# Patient Record
Sex: Female | Born: 1975 | Race: White | Hispanic: No | Marital: Married | Smoking: Former smoker
Health system: Southern US, Community
[De-identification: ages and names within clinical notes are randomized; demographics above are authoritative.]

## PROBLEM LIST (undated history)

## (undated) DIAGNOSIS — G43909 Migraine, unspecified, not intractable, without status migrainosus: Secondary | ICD-10-CM

## (undated) DIAGNOSIS — N63 Unspecified lump in unspecified breast: Secondary | ICD-10-CM

## (undated) HISTORY — PX: BREAST LUMPECTOMY: SHX2

## (undated) HISTORY — PX: COLONOSCOPY: SHX174

## (undated) HISTORY — DX: Migraine, unspecified, not intractable, without status migrainosus: G43.909

## (undated) HISTORY — PX: BREAST EXCISIONAL BIOPSY: SUR124

## (undated) HISTORY — PX: ABDOMINAL HYSTERECTOMY: SHX81

---

## 2001-12-26 HISTORY — PX: APPENDECTOMY: SHX54

## 2003-02-20 ENCOUNTER — Encounter: Payer: Self-pay | Admitting: Obstetrics and Gynecology

## 2003-02-20 ENCOUNTER — Encounter: Admission: RE | Admit: 2003-02-20 | Discharge: 2003-02-20 | Payer: Self-pay | Admitting: Obstetrics and Gynecology

## 2003-02-20 ENCOUNTER — Other Ambulatory Visit: Admission: RE | Admit: 2003-02-20 | Discharge: 2003-02-20 | Payer: Self-pay | Admitting: Obstetrics and Gynecology

## 2004-02-24 ENCOUNTER — Other Ambulatory Visit: Admission: RE | Admit: 2004-02-24 | Discharge: 2004-02-24 | Payer: Self-pay | Admitting: Obstetrics and Gynecology

## 2004-03-09 ENCOUNTER — Encounter: Admission: RE | Admit: 2004-03-09 | Discharge: 2004-03-09 | Payer: Self-pay | Admitting: Obstetrics and Gynecology

## 2005-03-22 ENCOUNTER — Other Ambulatory Visit: Admission: RE | Admit: 2005-03-22 | Discharge: 2005-03-22 | Payer: Self-pay | Admitting: Obstetrics and Gynecology

## 2005-03-25 ENCOUNTER — Encounter: Admission: RE | Admit: 2005-03-25 | Discharge: 2005-03-25 | Payer: Self-pay | Admitting: Obstetrics and Gynecology

## 2006-02-03 ENCOUNTER — Encounter: Admission: RE | Admit: 2006-02-03 | Discharge: 2006-02-03 | Payer: Self-pay | Admitting: Family Medicine

## 2006-04-11 ENCOUNTER — Other Ambulatory Visit: Admission: RE | Admit: 2006-04-11 | Discharge: 2006-04-11 | Payer: Self-pay | Admitting: Obstetrics and Gynecology

## 2007-07-27 ENCOUNTER — Ambulatory Visit (HOSPITAL_COMMUNITY): Admission: RE | Admit: 2007-07-27 | Discharge: 2007-07-27 | Payer: Self-pay | Admitting: Obstetrics and Gynecology

## 2007-08-30 ENCOUNTER — Inpatient Hospital Stay (HOSPITAL_COMMUNITY): Admission: AD | Admit: 2007-08-30 | Discharge: 2007-08-30 | Payer: Self-pay | Admitting: Obstetrics and Gynecology

## 2007-10-02 ENCOUNTER — Inpatient Hospital Stay (HOSPITAL_COMMUNITY): Admission: AD | Admit: 2007-10-02 | Discharge: 2007-10-05 | Payer: Self-pay | Admitting: Obstetrics and Gynecology

## 2007-10-03 ENCOUNTER — Encounter (HOSPITAL_COMMUNITY): Payer: Self-pay | Admitting: Obstetrics and Gynecology

## 2008-03-08 ENCOUNTER — Encounter: Admission: RE | Admit: 2008-03-08 | Discharge: 2008-03-08 | Payer: Self-pay | Admitting: Otolaryngology

## 2008-03-26 ENCOUNTER — Encounter: Payer: Self-pay | Admitting: Otolaryngology

## 2008-03-31 ENCOUNTER — Ambulatory Visit (HOSPITAL_COMMUNITY): Admission: RE | Admit: 2008-03-31 | Discharge: 2008-03-31 | Payer: Self-pay | Admitting: Interventional Radiology

## 2008-07-03 ENCOUNTER — Ambulatory Visit: Payer: Self-pay | Admitting: Gastroenterology

## 2008-07-03 DIAGNOSIS — R1032 Left lower quadrant pain: Secondary | ICD-10-CM | POA: Insufficient documentation

## 2008-07-07 ENCOUNTER — Ambulatory Visit: Payer: Self-pay | Admitting: Gastroenterology

## 2008-08-06 ENCOUNTER — Ambulatory Visit: Payer: Self-pay | Admitting: Gastroenterology

## 2008-08-29 ENCOUNTER — Encounter: Admission: RE | Admit: 2008-08-29 | Discharge: 2008-08-29 | Payer: Self-pay | Admitting: Family Medicine

## 2009-04-20 ENCOUNTER — Ambulatory Visit (HOSPITAL_COMMUNITY): Admission: RE | Admit: 2009-04-20 | Discharge: 2009-04-20 | Payer: Self-pay | Admitting: Interventional Radiology

## 2010-03-11 ENCOUNTER — Encounter: Admission: RE | Admit: 2010-03-11 | Discharge: 2010-03-11 | Payer: Self-pay | Admitting: Family Medicine

## 2010-04-29 ENCOUNTER — Inpatient Hospital Stay (HOSPITAL_COMMUNITY): Admission: AD | Admit: 2010-04-29 | Discharge: 2010-05-01 | Payer: Self-pay | Admitting: Obstetrics and Gynecology

## 2010-05-02 ENCOUNTER — Inpatient Hospital Stay (HOSPITAL_COMMUNITY): Admission: AD | Admit: 2010-05-02 | Discharge: 2010-05-02 | Payer: Self-pay | Admitting: Obstetrics and Gynecology

## 2010-06-16 ENCOUNTER — Inpatient Hospital Stay (HOSPITAL_COMMUNITY): Admission: AD | Admit: 2010-06-16 | Discharge: 2010-06-16 | Payer: Self-pay | Admitting: Obstetrics & Gynecology

## 2010-06-16 ENCOUNTER — Ambulatory Visit: Payer: Self-pay | Admitting: Advanced Practice Midwife

## 2010-07-26 ENCOUNTER — Inpatient Hospital Stay (HOSPITAL_COMMUNITY): Admission: AD | Admit: 2010-07-26 | Discharge: 2010-07-26 | Payer: Self-pay | Admitting: Obstetrics and Gynecology

## 2010-08-16 ENCOUNTER — Inpatient Hospital Stay (HOSPITAL_COMMUNITY): Admission: RE | Admit: 2010-08-16 | Discharge: 2010-08-19 | Payer: Self-pay | Admitting: Obstetrics and Gynecology

## 2011-03-11 LAB — CBC
HCT: 28.9 % — ABNORMAL LOW (ref 36.0–46.0)
HCT: 39.5 % (ref 36.0–46.0)
Hemoglobin: 10.1 g/dL — ABNORMAL LOW (ref 12.0–15.0)
Hemoglobin: 13.4 g/dL (ref 12.0–15.0)
MCHC: 34.8 g/dL (ref 30.0–36.0)
MCV: 91.5 fL (ref 78.0–100.0)
RBC: 3.15 MIL/uL — ABNORMAL LOW (ref 3.87–5.11)
RBC: 4.32 MIL/uL (ref 3.87–5.11)
RDW: 15.3 % (ref 11.5–15.5)
WBC: 8.3 10*3/uL (ref 4.0–10.5)
WBC: 9.1 10*3/uL (ref 4.0–10.5)

## 2011-03-11 LAB — SURGICAL PCR SCREEN
MRSA, PCR: NEGATIVE
Staphylococcus aureus: NEGATIVE

## 2011-03-13 LAB — URINALYSIS, ROUTINE W REFLEX MICROSCOPIC
Glucose, UA: NEGATIVE mg/dL
Hgb urine dipstick: NEGATIVE
Protein, ur: NEGATIVE mg/dL
Specific Gravity, Urine: <1.005 — ABNORMAL LOW (ref 1.005–1.030)

## 2011-03-15 LAB — URINALYSIS, ROUTINE W REFLEX MICROSCOPIC
Glucose, UA: NEGATIVE mg/dL
Ketones, ur: NEGATIVE mg/dL
Nitrite: NEGATIVE
Nitrite: NEGATIVE
Protein, ur: NEGATIVE mg/dL
Specific Gravity, Urine: <1.005 — ABNORMAL LOW (ref 1.005–1.030)
Urobilinogen, UA: 0.2 mg/dL (ref 0.0–1.0)
pH: 6.5 (ref 5.0–8.0)
pH: 7 (ref 5.0–8.0)

## 2011-03-15 LAB — CBC
MCHC: 34.3 g/dL (ref 30.0–36.0)
RBC: 3.87 MIL/uL (ref 3.87–5.11)
RDW: 12.8 % (ref 11.5–15.5)

## 2011-03-15 LAB — URINE MICROSCOPIC-ADD ON

## 2011-03-15 LAB — DIFFERENTIAL
Basophils Absolute: 0 10*3/uL (ref 0.0–0.1)
Basophils Relative: 0 % (ref 0–1)
Lymphocytes Relative: 26 % (ref 12–46)
Neutro Abs: 7.6 10*3/uL (ref 1.7–7.7)
Neutrophils Relative %: 67 % (ref 43–77)

## 2011-03-15 LAB — FETAL FIBRONECTIN: Fetal Fibronectin: NEGATIVE

## 2011-09-20 LAB — CBC
Hemoglobin: 13.9
RBC: 4.53
RDW: 12.4
WBC: 6.6

## 2011-09-20 LAB — BASIC METABOLIC PANEL
Calcium: 9.6
GFR calc Af Amer: >60
GFR calc non Af Amer: >60
Sodium: 140

## 2011-09-20 LAB — PROTIME-INR
INR: 1
Prothrombin Time: 13

## 2011-10-06 LAB — CBC
HCT: 34.1 — ABNORMAL LOW
Hemoglobin: 11.8 — ABNORMAL LOW
Hemoglobin: 12.9
MCV: 89.7
RDW: 14.4 — ABNORMAL HIGH
WBC: 11.1 — ABNORMAL HIGH

## 2011-10-06 LAB — RH IMMUNE GLOB WKUP(>/=20WKS)(NOT WOMEN'S HOSP): Fetal Screen: NEGATIVE

## 2011-10-06 LAB — RAPID HIV SCREEN (WH-MAU): Rapid HIV Screen: NONREACTIVE

## 2011-10-06 LAB — RPR: RPR Ser Ql: NONREACTIVE

## 2011-10-07 LAB — URINALYSIS, ROUTINE W REFLEX MICROSCOPIC
Bilirubin Urine: NEGATIVE
Glucose, UA: NEGATIVE
Hgb urine dipstick: NEGATIVE
Ketones, ur: NEGATIVE
Specific Gravity, Urine: 1.01
pH: 7

## 2011-10-10 LAB — RH IMMUNE GLOBULIN WORKUP (NOT WOMEN'S HOSP)
ABO/RH(D): O NEG
Antibody Screen: NEGATIVE

## 2012-08-24 ENCOUNTER — Other Ambulatory Visit: Payer: Self-pay | Admitting: Chiropractic Medicine

## 2012-08-24 ENCOUNTER — Ambulatory Visit
Admission: RE | Admit: 2012-08-24 | Discharge: 2012-08-24 | Disposition: A | Discharge status: Home / Self Care | Payer: BC Managed Care – PPO | Source: Ambulatory Visit | Attending: Chiropractic Medicine | Admitting: Chiropractic Medicine

## 2012-08-24 DIAGNOSIS — M542 Cervicalgia: Secondary | ICD-10-CM

## 2012-08-24 DIAGNOSIS — S33100A Subluxation of unspecified lumbar vertebra, initial encounter: Secondary | ICD-10-CM

## 2012-08-24 DIAGNOSIS — M5412 Radiculopathy, cervical region: Secondary | ICD-10-CM

## 2013-01-31 ENCOUNTER — Other Ambulatory Visit: Payer: Self-pay | Admitting: Gastroenterology

## 2013-01-31 DIAGNOSIS — K5792 Diverticulitis of intestine, part unspecified, without perforation or abscess without bleeding: Secondary | ICD-10-CM

## 2013-02-01 ENCOUNTER — Ambulatory Visit
Admission: RE | Admit: 2013-02-01 | Discharge: 2013-02-01 | Disposition: A | Discharge status: Home / Self Care | Payer: BC Managed Care – PPO | Source: Ambulatory Visit | Attending: Gastroenterology | Admitting: Gastroenterology

## 2013-02-01 DIAGNOSIS — K5792 Diverticulitis of intestine, part unspecified, without perforation or abscess without bleeding: Secondary | ICD-10-CM

## 2013-02-01 MED ORDER — IOHEXOL 300 MG/ML  SOLN
100.0000 mL | Freq: Once | INTRAMUSCULAR | Status: AC | PRN
  Administered 2013-02-01: 100 mL via INTRAVENOUS

## 2013-12-02 ENCOUNTER — Encounter: Payer: Self-pay | Admitting: *Deleted

## 2013-12-03 ENCOUNTER — Encounter (INDEPENDENT_AMBULATORY_CARE_PROVIDER_SITE_OTHER): Payer: Self-pay

## 2013-12-03 ENCOUNTER — Ambulatory Visit (INDEPENDENT_AMBULATORY_CARE_PROVIDER_SITE_OTHER): Payer: BC Managed Care – PPO | Admitting: Cardiology

## 2013-12-03 ENCOUNTER — Encounter: Payer: Self-pay | Admitting: Cardiology

## 2013-12-03 VITALS — BP 116/62 | HR 52 | Ht 65.0 in | Wt 170.0 lb

## 2013-12-03 DIAGNOSIS — R0989 Other specified symptoms and signs involving the circulatory and respiratory systems: Secondary | ICD-10-CM

## 2013-12-03 DIAGNOSIS — I951 Orthostatic hypotension: Secondary | ICD-10-CM | POA: Insufficient documentation

## 2013-12-03 DIAGNOSIS — I498 Other specified cardiac arrhythmias: Secondary | ICD-10-CM

## 2013-12-03 DIAGNOSIS — R0600 Dyspnea, unspecified: Secondary | ICD-10-CM

## 2013-12-03 DIAGNOSIS — R42 Dizziness and giddiness: Secondary | ICD-10-CM

## 2013-12-03 DIAGNOSIS — R0609 Other forms of dyspnea: Secondary | ICD-10-CM

## 2013-12-03 DIAGNOSIS — R001 Bradycardia, unspecified: Secondary | ICD-10-CM

## 2013-12-03 DIAGNOSIS — R0602 Shortness of breath: Secondary | ICD-10-CM

## 2013-12-03 NOTE — Patient Instructions (Signed)
Schedule 24 hour holter monitor   Schedule Treadmill follow instructions given   Your physician recommends that you schedule a follow-up appointment after test

## 2013-12-03 NOTE — Progress Notes (Signed)
Patient ID: FRUMA AFR[place], female   DOB: [birth year], 37 y.o.   MRN: [id number]    Patient Name: Hayley Hamilton Date of Encounter: 12/03/2013  Primary Care Provider:  No primary provider on file. Primary Cardiologist:  Tobias Alexander, H  Problem List   No past medical history on file. Past Surgical History  Procedure Laterality Date  . Colonoscopy    . Appendectomy  2003  . Breast lumpectomy Right     benign   Allergies  Allergies  Allergen Reactions  . Codeine    HPI  A very pleasant 37 year old female who is coming for concern of this on exertion. The patient states that in the past she was very active participating in competitive sports.  2 years ago she started to exercise again and is trying to go to aerobic classes and to run. However in about 20 minutes into exercise or at about quarter mile after she developed significant shortness of breath that prevents her from further activity. She has never experienced chest pain. She was seen as her primary care office and was found to be bradycardic and that's when she got referred to [place]. She states that her blood pressure has always been on the lower side and she for the last couple years has experienced orthostatic hypotension when she feels dizzy almost every time she stands up. No recent history of syncope.  Home Medications  Prior to Admission medications   Medication Sig Start Date End Date Taking? Authorizing Provider  escitalopram (LEXAPRO) 20 MG tablet Take 20 mg by mouth daily.   Yes Historical Provider, MD  LORazepam (ATIVAN) 0.5 MG tablet Take 0.5 mg by mouth every 8 (eight) hours as needed for anxiety.   Yes Historical Provider, MD    Family History  Family History  Problem Relation Age of Onset  . Colon cancer Paternal Grandfather   . Colon cancer Paternal Uncle   . Colon polyps Father   . Colon polyps Paternal Grandfather   . Colon polyps Paternal Uncle   . Diabetes Paternal Grandfather   . Diabetes  Paternal Grandmother   . Diabetes Maternal Grandmother   . Diabetes Maternal Grandfather   . Heart disease Paternal Grandfather   . Irritable bowel syndrome Paternal Grandmother     Social History  History   Social History  . Marital Status: Married    Spouse Name: N/A    Number of Children: N/A  . Years of Education: N/A   Occupational History  . Not on file.   Social History Main Topics  . Smoking status: Former Games developer  . Smokeless tobacco: Not on file     Comment: quit 2006-2007  . Alcohol Use: No  . Drug Use: No  . Sexual Activity: Not on file   Other Topics Concern  . Not on file   Social History Narrative  . No narrative on file     Review of Systems, as per HPI, otherwise negative General:  No chills, fever, night sweats or weight changes.  Cardiovascular:  No chest pain, dyspnea on exertion, edema, orthopnea, palpitations, paroxysmal nocturnal dyspnea. Dermatological: No rash, lesions/masses Respiratory: No cough, dyspnea Urologic: No hematuria, dysuria Abdominal:   No nausea, vomiting, diarrhea, bright red blood per rectum, melena, or hematemesis Neurologic:  No visual changes, wkns, changes in mental status. All other systems reviewed and are otherwise negative except as noted above.  Physical Exam  Blood pressure 116/62, pulse 52, height 5\' 5"  (1.651 m), weight 170 lb (  77.111 kg).  General: Pleasant, NAD Psych: Normal affect. Neuro: Alert and oriented X 3. Moves all extremities spontaneously. HEENT: Normal  Neck: Supple without bruits or JVD. Lungs:  Resp regular and unlabored, CTA. Heart: RRR no s3, s4, or murmurs. Abdomen: Soft, non-tender, non-distended, BS + x 4.  Extremities: No clubbing, cyanosis or edema. DP/PT/Radials 2+ and equal bilaterally.  Labs:  No results found for this basename: CKTOTAL, CKMB, TROPONINI,  in the last 72 hours Lab Results  Component Value Date   WBC 8.3 08/17/2010   HGB 10.1* 08/17/2010   HCT 28.9* 08/17/2010    MCV 91.8 08/17/2010   PLT 236 08/17/2010    Accessory Clinical Findings  echocardiogram  ECG - sinus bradycardia, otherwise normal EKG.   Assessment & Plan  37 year old healthy female with dyspnea on exertion and bradycardia. We will order an exercise treadmill test to assess her functional capacity and heart rate response to exercise. There is some concern for possible sick sinus syndrome and sinus node incompetence as she describes episodes of sudden onset tachycardia that can last hours to days but only occur every couple of months.  We will also order a 24-hour Holter monitor to assess her heart rate during the day and at night and possible pauses.  She was evaluated for orthostatic hypotension and in fact she had 20 mm mercury drop in systolic blood pressure concerning for syndrome such as Shy-Drager syndrome.  Patient's lipid profile was at goal at her last visit and her primary care physician ( HDL 58, TAG 70, LDL 101).  Follow up in 3 weelks.    Lars Masson, MD, [facility] 12/03/2013, 4:15 PM

## 2013-12-04 ENCOUNTER — Other Ambulatory Visit: Payer: Self-pay | Admitting: Family Medicine

## 2013-12-04 ENCOUNTER — Encounter: Payer: Self-pay | Admitting: Cardiology

## 2013-12-06 ENCOUNTER — Ambulatory Visit (HOSPITAL_COMMUNITY)
Admission: RE | Admit: 2013-12-06 | Discharge: 2013-12-06 | Disposition: A | Discharge status: Home / Self Care | Payer: BC Managed Care – PPO | Source: Ambulatory Visit | Attending: Cardiovascular Disease | Admitting: Cardiovascular Disease

## 2013-12-06 DIAGNOSIS — R0602 Shortness of breath: Secondary | ICD-10-CM

## 2013-12-06 DIAGNOSIS — R001 Bradycardia, unspecified: Secondary | ICD-10-CM

## 2013-12-06 DIAGNOSIS — R42 Dizziness and giddiness: Secondary | ICD-10-CM

## 2013-12-06 DIAGNOSIS — I498 Other specified cardiac arrhythmias: Secondary | ICD-10-CM | POA: Insufficient documentation

## 2013-12-12 ENCOUNTER — Encounter (INDEPENDENT_AMBULATORY_CARE_PROVIDER_SITE_OTHER): Payer: BC Managed Care – PPO

## 2013-12-12 ENCOUNTER — Encounter: Payer: Self-pay | Admitting: *Deleted

## 2013-12-12 DIAGNOSIS — R001 Bradycardia, unspecified: Secondary | ICD-10-CM

## 2013-12-12 DIAGNOSIS — R0602 Shortness of breath: Secondary | ICD-10-CM

## 2013-12-12 DIAGNOSIS — R42 Dizziness and giddiness: Secondary | ICD-10-CM

## 2013-12-12 NOTE — Progress Notes (Signed)
Patient ID: Hayley Hamilton, female   DOB: September 10, 1976, 37 y.o.   MRN: [id number] [facility] 24 hour holter monitor applied to patient.

## 2013-12-13 ENCOUNTER — Telehealth: Payer: Self-pay | Admitting: Cardiology

## 2013-12-13 NOTE — Telephone Encounter (Signed)
Pt is aware that her stress test is normal.

## 2013-12-13 NOTE — Telephone Encounter (Signed)
Follow Up ° °Pt returned call for results// please call  °

## 2013-12-13 NOTE — Telephone Encounter (Signed)
Left pt a message to call back. 

## 2013-12-25 ENCOUNTER — Telehealth: Payer: Self-pay | Admitting: *Deleted

## 2013-12-25 NOTE — Telephone Encounter (Signed)
Left message on voicemail normal monitor results as read by Dr. Delton See [email or web address] Mylo Red RN

## 2014-01-01 ENCOUNTER — Telehealth: Payer: Self-pay

## 2014-01-01 NOTE — Telephone Encounter (Signed)
[email or web address] gxt normal.

## 2014-08-07 ENCOUNTER — Encounter: Payer: Self-pay | Admitting: Gastroenterology

## 2014-10-10 ENCOUNTER — Other Ambulatory Visit: Payer: Self-pay

## 2014-12-09 ENCOUNTER — Other Ambulatory Visit: Payer: Self-pay | Admitting: Obstetrics and Gynecology

## 2014-12-10 LAB — CYTOLOGY - PAP

## 2015-01-21 ENCOUNTER — Other Ambulatory Visit: Payer: Self-pay | Admitting: Obstetrics and Gynecology

## 2015-04-26 ENCOUNTER — Emergency Department (HOSPITAL_BASED_OUTPATIENT_CLINIC_OR_DEPARTMENT_OTHER)
Admission: EM | Admit: 2015-04-26 | Discharge: 2015-04-26 | Disposition: A | Discharge status: Home / Self Care | Payer: BC Managed Care – PPO | Attending: Emergency Medicine | Admitting: Emergency Medicine

## 2015-04-26 ENCOUNTER — Emergency Department (HOSPITAL_BASED_OUTPATIENT_CLINIC_OR_DEPARTMENT_OTHER): Payer: BC Managed Care – PPO

## 2015-04-26 ENCOUNTER — Encounter (HOSPITAL_BASED_OUTPATIENT_CLINIC_OR_DEPARTMENT_OTHER): Payer: Self-pay | Admitting: *Deleted

## 2015-04-26 DIAGNOSIS — X58XXXA Exposure to other specified factors, initial encounter: Secondary | ICD-10-CM | POA: Diagnosis not present

## 2015-04-26 DIAGNOSIS — Y9289 Other specified places as the place of occurrence of the external cause: Secondary | ICD-10-CM | POA: Diagnosis not present

## 2015-04-26 DIAGNOSIS — Y9389 Activity, other specified: Secondary | ICD-10-CM | POA: Diagnosis not present

## 2015-04-26 DIAGNOSIS — Y998 Other external cause status: Secondary | ICD-10-CM | POA: Diagnosis not present

## 2015-04-26 DIAGNOSIS — S93401A Sprain of unspecified ligament of right ankle, initial encounter: Secondary | ICD-10-CM | POA: Diagnosis not present

## 2015-04-26 DIAGNOSIS — S99911A Unspecified injury of right ankle, initial encounter: Secondary | ICD-10-CM | POA: Diagnosis present

## 2015-04-26 DIAGNOSIS — Z79899 Other long term (current) drug therapy: Secondary | ICD-10-CM | POA: Insufficient documentation

## 2015-04-26 DIAGNOSIS — Z87891 Personal history of nicotine dependence: Secondary | ICD-10-CM | POA: Diagnosis not present

## 2015-04-26 MED ORDER — NAPROXEN 500 MG PO TABS: 500.0000 mg | ORAL_TABLET | Freq: Two times a day (BID) | ORAL | Status: DC

## 2015-04-26 MED ORDER — HYDROCODONE-ACETAMINOPHEN 5-325 MG PO TABS: 1.0000 | ORAL_TABLET | Freq: Four times a day (QID) | ORAL | Status: DC | PRN

## 2015-04-26 NOTE — ED Notes (Signed)
Pt reports that she stepped off her deck and injured her (R) foot.  Bruising and swelling noted

## 2015-04-26 NOTE — Discharge Instructions (Signed)
Ankle Sprain An ankle sprain is an injury to the strong, fibrous tissues (ligaments) that hold your ankle bones together.  HOME CARE   Put ice on your ankle for 1-2 days or as told by your doctor.  Put ice in a plastic bag.  Place a towel between your skin and the bag.  Leave the ice on for 15-20 minutes at a time, every 2 hours while you are awake.  Only take medicine as told by your doctor.  Raise (elevate) your injured ankle above the level of your heart as much as possible for 2-3 days.  Use crutches if your doctor tells you to. Slowly put your own weight on the affected ankle. Use the crutches until you can walk without pain.  If you have a plaster splint:  Do not rest it on anything harder than a pillow for 24 hours.  Do not put weight on it.  Do not get it wet.  Take it off to shower or bathe.  If given, use an elastic wrap or support stocking for support. Take the wrap off if your toes lose feeling (numb), tingle, or turn cold or blue.  If you have an air splint:  Add or let out air to make it comfortable.  Take it off at night and to shower and bathe.  Wiggle your toes and move your ankle up and down often while you are wearing it. GET HELP IF:  You have rapidly increasing bruising or puffiness (swelling).  Your toes feel very cold.  You lose feeling in your foot.  Your medicine does not help your pain. GET HELP RIGHT AWAY IF:   Your toes lose feeling (numb) or turn blue.  You have severe pain that is increasing. MAKE SURE YOU:   Understand these instructions.  Will watch your condition.  Will get help right away if you are not doing well or get worse. Document Released: 05/30/2008 Document Revised: 04/28/2014 Document Reviewed: 06/25/2012 [facility] Patient Information 2015 [place], [place]. This information is not intended to replace advice given to you by your health care provider. Make sure you discuss any questions you have with your health care  provider.  Follow-up with sports medicine upstairs referral information provided. Probably okay to bear weight on the right foot when you can tolerate it and walk tolerated in the meantime use the crutches. Elevate the foot is much possible for the next 2 days. Take the Naprosyn on a regular basis and can supplement the need for additional pain control with the hydrocodone.

## 2015-04-26 NOTE — ED Provider Notes (Signed)
CSN: [id number]     Arrival date & time 04/26/15  1713 History  This chart was scribed for Vanetta MuldersScott Linnette Panella, MD by Roxy Cedarhandni Bhalodia, ED Scribe. This patient was seen in room MH06/MH06 and the patient's care was started at 5:49 PM.   Chief Complaint  Patient presents with  . Foot Injury   Patient is a 39 y.o. female presenting with foot injury. The history is provided by the patient. No language interpreter was used.  Foot Injury Location:  Foot Foot location:  R foot Pain details:    Quality:  Aching   Severity:  Moderate   Onset quality:  Sudden   Timing:  Constant Associated symptoms: no back pain and no fever     HPI Comments: Hayley Hamilton is a 39 y.o. female with a PMHx of appendectomy, breast lumpectomy and abdominal hysterectomy, who presents to the Emergency Department complaining of moderate pain to right foot and ankle that began 2 hours ago when patient stepped off her deck and injured it. She states that she overturned her ankle while stepping off deck. She reports associated bruising and swelling to right foot. Patient states that pain is currently radiating up her right lower leg. She currently rates her pain to be 8/10. Patient has side effects to codeine.  History reviewed. No pertinent past medical history. Past Surgical History  Procedure Laterality Date  . Colonoscopy    . Appendectomy  2003  . Breast lumpectomy Right     benign  . Abdominal hysterectomy     Family History  Problem Relation Age of Onset  . Colon cancer Paternal Grandfather   . Colon cancer Paternal Uncle   . Colon polyps Father   . Colon polyps Paternal Grandfather   . Colon polyps Paternal Uncle   . Diabetes Paternal Grandfather   . Diabetes Paternal Grandmother   . Diabetes Maternal Grandmother   . Diabetes Maternal Grandfather   . Heart disease Paternal Grandfather   . Irritable bowel syndrome Paternal Grandmother    History  Substance Use Topics  . Smoking status: Former Games developermoker  .  Smokeless tobacco: Not on file     Comment: quit 2006-2007  . Alcohol Use: No   OB History    No data available     Review of Systems  Constitutional: Negative for fever and chills.  HENT: Negative for congestion, rhinorrhea and sore throat.   Eyes: Negative for visual disturbance.  Respiratory: Negative for cough and shortness of breath.   Cardiovascular: Negative for chest pain and leg swelling.  Gastrointestinal: Negative for nausea, vomiting, abdominal pain and diarrhea.  Genitourinary: Negative for dysuria.  Musculoskeletal: Positive for joint swelling and arthralgias. Negative for back pain.  Skin: Negative for rash.  Neurological: Negative for headaches.  Hematological: Does not bruise/bleed easily.  Psychiatric/Behavioral: Negative for confusion.   Allergies  Codeine  Home Medications   Prior to Admission medications   Medication Sig Start Date End Date Taking? Authorizing Provider  zolpidem (AMBIEN) 5 MG tablet Take 5 mg by mouth at bedtime as needed for sleep.   Yes Historical Provider, MD  escitalopram (LEXAPRO) 20 MG tablet Take 20 mg by mouth daily.    Historical Provider, MD  HYDROcodone-acetaminophen (NORCO/VICODIN) 5-325 MG per tablet Take 1-2 tablets by mouth every 6 (six) hours as needed for moderate pain. 04/26/15   Vanetta MuldersScott Euva Rundell, MD  LORazepam (ATIVAN) 0.5 MG tablet Take 0.5 mg by mouth every 8 (eight) hours as needed for anxiety.  Historical Provider, MD  naproxen (NAPROSYN) 500 MG tablet Take 1 tablet (500 mg total) by mouth 2 (two) times daily. 04/26/15   Vanetta Mulders, MD   Triage Vitals: BP 109/41 mmHg  Pulse 57  Temp(Src) 97.6 F (36.4 C) (Oral)  Resp 18  Ht 5' 6.5" (1.689 m)  Wt 183 lb (83.008 kg)  BMI 29.10 kg/m2  SpO2 100%  LMP 08/20/2012  Physical Exam  Constitutional: She is oriented to person, place, and time. She appears well-developed and well-nourished. No distress.  HENT:  Head: Normocephalic and atraumatic.  Mouth/Throat:  Oropharynx is clear and moist. No oropharyngeal exudate.  Eyes: Conjunctivae and EOM are normal.  Neck: Neck supple. No tracheal deviation present.  Cardiovascular: Normal rate, regular rhythm and normal heart sounds.   Pulmonary/Chest: Effort normal and breath sounds normal. No respiratory distress.  Abdominal: Soft. Bowel sounds are normal.  Musculoskeletal: Normal range of motion. She exhibits edema and tenderness.  Capillary refill 1 second in right foot; DP 2+; no proximal leg tenderness; no obvious deformity of right foot; lateral swelling and lateral tenderness on ankle.  Neurological: She is alert and oriented to person, place, and time. No cranial nerve deficit. Coordination normal.  Skin: Skin is warm and dry.  Psychiatric: She has a normal mood and affect. Her behavior is normal.  Nursing note and vitals reviewed.  ED Course  Procedures (including critical care time)  DIAGNOSTIC STUDIES: Oxygen Saturation is 100% on RA, normal by my interpretation.    COORDINATION OF CARE: 5:54 PM- Ordered diagnostic imaging of right foot. Will give patient medication for pain management. Will give patient work note. Will refer patient to sports med specialist if needed. Pt advised of plan for treatment and pt agrees.   Dg Ankle Complete Right  04/26/2015   CLINICAL DATA:  Right foot and ankle pain after injury stepping off of a deck. Bruising and swelling.  EXAM: RIGHT ANKLE - COMPLETE 3+ VIEW  COMPARISON:  None.  FINDINGS: Soft tissue swelling laterally along the midfoot, with a 5 by 1 mm linear calcification along the lateral margin of the cuboid and medial cuneiform close to this area of soft tissue swelling.  Plafond and talar dome appear intact. The malleoli appear intact. Exaggerated longitudinal arch of the foot noted.  IMPRESSION: 1. Lateral soft tissue swelling along the midfoot. Linear calcification along the distal margin of the cuboid and lateral cuneiform laterally could represent a  small avulsion fragment given its proximity to the soft tissue swelling, although donor site is on certain.   Electronically Signed   By: Gaylyn Rong M.D.   On: 04/26/2015 18:45   Dg Foot Complete Right  04/26/2015   CLINICAL DATA:  Slipped at home a twisted her right foot, heard a pop, pain laterally with swelling  EXAM: RIGHT FOOT COMPLETE - 3+ VIEW  COMPARISON:  None.  FINDINGS: Small bone island in the medial cuneiform. No fracture or dislocation. Tiny focus of nonspecific soft tissue calcification measuring about 2 mm in the lateral midfoot soft tissues not likely to be of acute consequence.  IMPRESSION: No acute findings   Electronically Signed   By: Esperanza Heir M.D.   On: 04/26/2015 17:43   MDM    Final diagnoses:  Ankle sprain, right, initial encounter   Symptoms most consistent with a right ankle sprain. The questionable small avulsion fractures to the cuboid bones noted. Will treat with crutches ankle wrap and follow-up with sports medicine. Patient also treated with Naprosyn  will elevate the leg is much as possible for the next 2 days. In his hydrocodone as needed for additional pain.  I personally performed the services described in this documentation, which was scribed in my presence. The recorded information has been reviewed and is accurate.    Vanetta Mulders, MD 04/26/15 [phone]

## 2015-04-28 ENCOUNTER — Encounter: Payer: Self-pay | Admitting: Family Medicine

## 2015-04-28 ENCOUNTER — Ambulatory Visit (INDEPENDENT_AMBULATORY_CARE_PROVIDER_SITE_OTHER): Payer: BC Managed Care – PPO | Admitting: Family Medicine

## 2015-04-28 VITALS — BP 111/73 | HR 66 | Ht 67.0 in | Wt 183.0 lb

## 2015-04-28 DIAGNOSIS — S99911A Unspecified injury of right ankle, initial encounter: Secondary | ICD-10-CM

## 2015-04-28 NOTE — Patient Instructions (Signed)
You have a Grade 3 ankle sprain. I would expect about 6 weeks to recover from this. Ice the area for 15 minutes at a time, 3-4 times a day Aleve 2 tabs twice a day with food OR ibuprofen 3 tabs three times a day with food for pain and inflammation. Elevate above the level of your heart when possible Crutches if needed to help with walking Bear weight when tolerated Use laceup ankle brace or boot to help with stability while you recover from this injury. Come out of the boot/brace twice a day to do Up/down and alphabet exercises 2-3 sets of each. Consider physical therapy for strengthening and balance exercises in the future. Follow up with me in 2 weeks at the latest.

## 2015-04-29 DIAGNOSIS — S99911A Unspecified injury of right ankle, initial encounter: Secondary | ICD-10-CM | POA: Insufficient documentation

## 2015-04-29 NOTE — Progress Notes (Signed)
PCP: Thora LanceEHINGER,ROBERT R, MD  Subjective:   HPI: Patient is a 39 y.o. female here for right foot injury.  Patient reports on 5/1 she accidentally stepped off the bottom step of the deck and rolled her ankle, inversion injury. + pop with swelling. Was close to passing out after the injury. No prior injuries. Taking naproxen, hydrocodone, given ASO but hurts with this. Able to put a little weight on this now but not right after the injury.  No past medical history on file.  Current Outpatient Prescriptions on File Prior to Visit  Medication Sig Dispense Refill  . escitalopram (LEXAPRO) 20 MG tablet Take 20 mg by mouth daily.    Marland Kitchen. HYDROcodone-acetaminophen (NORCO/VICODIN) 5-325 MG per tablet Take 1-2 tablets by mouth every 6 (six) hours as needed for moderate pain. 14 tablet 0  . LORazepam (ATIVAN) 0.5 MG tablet Take 0.5 mg by mouth every 8 (eight) hours as needed for anxiety.    . naproxen (NAPROSYN) 500 MG tablet Take 1 tablet (500 mg total) by mouth 2 (two) times daily. 14 tablet 0  . zolpidem (AMBIEN) 5 MG tablet Take 5 mg by mouth at bedtime as needed for sleep.     No current facility-administered medications on file prior to visit.    Past Surgical History  Procedure Laterality Date  . Colonoscopy    . Appendectomy  2003  . Breast lumpectomy Right     benign  . Abdominal hysterectomy      Allergies  Allergen Reactions  . Codeine     History   Social History  . Marital Status: Married    Spouse Name: N/A  . Number of Children: N/A  . Years of Education: N/A   Occupational History  . Not on file.   Social History Main Topics  . Smoking status: Former Games developermoker  . Smokeless tobacco: Not on file     Comment: quit 2006-2007  . Alcohol Use: No  . Drug Use: No  . Sexual Activity: Not on file   Other Topics Concern  . Not on file   Social History Narrative    Family History  Problem Relation Age of Onset  . Colon cancer Paternal Grandfather   . Colon cancer  Paternal Uncle   . Colon polyps Father   . Colon polyps Paternal Grandfather   . Colon polyps Paternal Uncle   . Diabetes Paternal Grandfather   . Diabetes Paternal Grandmother   . Diabetes Maternal Grandmother   . Diabetes Maternal Grandfather   . Heart disease Paternal Grandfather   . Irritable bowel syndrome Paternal Grandmother     BP 111/73 mmHg  Pulse 66  Ht 5\' 7"  (1.702 m)  Wt 183 lb (83.008 kg)  BMI 28.66 kg/m2  LMP 08/20/2012  Review of Systems: See HPI above.    Objective:  Physical Exam:  Gen: NAD  Right foot/ankle: Mod lateral ankle swelling.  Mild bruising here.  No other deformity. Mild limitation ROM all directions but able to do so. TTP over ATFL.  No tenderness over cuboid, medial cuneiform. 1+ ant drawer and negative talar tilt.   Negative syndesmotic compression. Thompsons test negative. NV intact distally.    Assessment & Plan:  1. Right ankle injury - consistent with sprain based on exam, location of pain.  She does not have tenderness over the questionable calcification/fracture area of cuboid/medial cuneiform.  Try to use the ASO for support but can use cam walker if needed instead with crutches as needed.  Elevation, icing, nsaids.  Shown home exercises to do daily.  F/u in 2 weeks for reevaluation - hopefully can go ahead with strengthening, possibly PT then.

## 2015-04-29 NOTE — Assessment & Plan Note (Signed)
consistent with sprain based on exam, location of pain.  She does not have tenderness over the questionable calcification/fracture area of cuboid/medial cuneiform.  Try to use the ASO for support but can use cam walker if needed instead with crutches as needed.  Elevation, icing, nsaids.  Shown home exercises to do daily.  F/u in 2 weeks for reevaluation - hopefully can go ahead with strengthening, possibly PT then.

## 2015-05-12 ENCOUNTER — Encounter: Payer: Self-pay | Admitting: Family Medicine

## 2015-05-12 ENCOUNTER — Ambulatory Visit: Payer: BC Managed Care – PPO | Admitting: Family Medicine

## 2015-05-12 ENCOUNTER — Ambulatory Visit (INDEPENDENT_AMBULATORY_CARE_PROVIDER_SITE_OTHER): Payer: BC Managed Care – PPO | Admitting: Family Medicine

## 2015-05-12 VITALS — BP 108/69 | HR 54 | Ht 67.0 in | Wt 182.5 lb

## 2015-05-12 DIAGNOSIS — S99911D Unspecified injury of right ankle, subsequent encounter: Secondary | ICD-10-CM | POA: Diagnosis not present

## 2015-05-12 NOTE — Patient Instructions (Signed)
Transition to the laceup brace when you can. Start theraband strengthening exercises in about 1 week. Continue the up/down and alphabet exercises now. Icing 15 minutes at a time 3-4 times a day. Elevate as needed for swelling. Ibuprofen as needed for pain, inflammation. Follow up with me in 4 weeks. Call me if you want to do physical therapy.

## 2015-05-14 NOTE — Progress Notes (Signed)
PCP: Thora LanceEHINGER,ROBERT R, MD  Subjective:   HPI: Patient is a 39 y.o. female here for right foot injury.  5/3: Patient reports on 5/1 she accidentally stepped off the bottom step of the deck and rolled her ankle, inversion injury. + pop with swelling. Was close to passing out after the injury. No prior injuries. Taking naproxen, hydrocodone, given ASO but hurts with this. Able to put a little weight on this now but not right after the injury.  5/17: Patient reports she is at least 50% improved from last visit. Able to put some weight on foot without boot. Some pain at 1/10 level laterally. Taking ibuprofen. Some swelling still.  No past medical history on file.  Current Outpatient Prescriptions on File Prior to Visit  Medication Sig Dispense Refill  . escitalopram (LEXAPRO) 20 MG tablet Take 20 mg by mouth daily.    Marland Kitchen. HYDROcodone-acetaminophen (NORCO/VICODIN) 5-325 MG per tablet Take 1-2 tablets by mouth every 6 (six) hours as needed for moderate pain. 14 tablet 0  . LORazepam (ATIVAN) 0.5 MG tablet Take 0.5 mg by mouth every 8 (eight) hours as needed for anxiety.    . naproxen (NAPROSYN) 500 MG tablet Take 1 tablet (500 mg total) by mouth 2 (two) times daily. 14 tablet 0  . zolpidem (AMBIEN) 5 MG tablet Take 5 mg by mouth at bedtime as needed for sleep.     No current facility-administered medications on file prior to visit.    Past Surgical History  Procedure Laterality Date  . Colonoscopy    . Appendectomy  2003  . Breast lumpectomy Right     benign  . Abdominal hysterectomy      Allergies  Allergen Reactions  . Codeine     History   Social History  . Marital Status: Married    Spouse Name: N/A  . Number of Children: N/A  . Years of Education: N/A   Occupational History  . Not on file.   Social History Main Topics  . Smoking status: Former Games developermoker  . Smokeless tobacco: Not on file     Comment: quit 2006-2007  . Alcohol Use: No  . Drug Use: No  . Sexual  Activity: Not on file   Other Topics Concern  . Not on file   Social History Narrative    Family History  Problem Relation Age of Onset  . Colon cancer Paternal Grandfather   . Colon cancer Paternal Uncle   . Colon polyps Father   . Colon polyps Paternal Grandfather   . Colon polyps Paternal Uncle   . Diabetes Paternal Grandfather   . Diabetes Paternal Grandmother   . Diabetes Maternal Grandmother   . Diabetes Maternal Grandfather   . Heart disease Paternal Grandfather   . Irritable bowel syndrome Paternal Grandmother     BP 108/69 mmHg  Pulse 54  Ht 5\' 7"  (1.702 m)  Wt 182 lb 8 oz (82.781 kg)  BMI 28.58 kg/m2  LMP 08/20/2012  Review of Systems: See HPI above.    Objective:  Physical Exam:  Gen: NAD  Right foot/ankle: Mild lateral ankle swelling.  No bruising, deformity. Mild limitation ROM all directions but able to do so. Mild TTP over ATFL.  No tenderness over cuboid, medial cuneiform. 1+ ant drawer and negative talar tilt.   Negative syndesmotic compression. Thompsons test negative. NV intact distally.    Assessment & Plan:  1. Right ankle injury - consistent with sprain based on exam, location of pain.  Improving  with cam walker.  Will transition out of this as tolerated to an ASO.  Start strengthening home exercises in about 1 week.  Icing, elevation, ibuprofen as needed.  F/u in 4 weeks.  Consider PT.

## 2015-05-14 NOTE — Assessment & Plan Note (Signed)
consistent with sprain based on exam, location of pain.  Improving with cam walker.  Will transition out of this as tolerated to an ASO.  Start strengthening home exercises in about 1 week.  Icing, elevation, ibuprofen as needed.  F/u in 4 weeks.  Consider PT.

## 2015-05-29 ENCOUNTER — Ambulatory Visit: Payer: BC Managed Care – PPO | Admitting: Family Medicine

## 2015-06-10 ENCOUNTER — Encounter: Payer: Self-pay | Admitting: Family Medicine

## 2015-06-10 ENCOUNTER — Ambulatory Visit (INDEPENDENT_AMBULATORY_CARE_PROVIDER_SITE_OTHER): Payer: BC Managed Care – PPO | Admitting: Family Medicine

## 2015-06-10 VITALS — BP 101/64 | HR 62 | Ht 67.0 in | Wt 180.0 lb

## 2015-06-10 DIAGNOSIS — S99911D Unspecified injury of right ankle, subsequent encounter: Secondary | ICD-10-CM | POA: Diagnosis not present

## 2015-06-10 NOTE — Patient Instructions (Signed)
Continue wearing the laceup brace when up and walking around. Continue home exercises, icing, ibuprofen if needed. Elevation for the swelling. Call me if you want to do physical therapy. Follow up with me in 6 weeks.

## 2015-06-11 NOTE — Assessment & Plan Note (Signed)
consistent with sprain based on exam, location of pain.  Minor setback 2 weeks ago.  She would like to continue with ASO, home exercises, icing, nsaids.  Call us if she would like to do PT.  F/u in 6 weeks.

## 2015-06-11 NOTE — Progress Notes (Signed)
PCP: Thora Lance, MD  Subjective:   HPI: Patient is a 39 y.o. female here for right foot injury.  5/3: Patient reports on 5/1 she accidentally stepped off the bottom step of the deck and rolled her ankle, inversion injury. + pop with swelling. Was close to passing out after the injury. No prior injuries. Taking naproxen, hydrocodone, given ASO but hurts with this. Able to put a little weight on this now but not right after the injury.  5/17: Patient reports she is at least 50% improved from last visit. Able to put some weight on foot without boot. Some pain at 1/10 level laterally. Taking ibuprofen. Some swelling still.  6/15: Overall patient is doing better. Still with 2/10 level of pain. She unfortunately had an injury 2 weeks ago and thinks she set herself back a little bit - unsure if she inverted her ankle but felt a lateral burning sensation and had bruising here in the morning. Using ASO, doing HEP.  No past medical history on file.  Current Outpatient Prescriptions on File Prior to Visit  Medication Sig Dispense Refill  . escitalopram (LEXAPRO) 20 MG tablet Take 20 mg by mouth daily.    Marland Kitchen HYDROcodone-acetaminophen (NORCO/VICODIN) 5-325 MG per tablet Take 1-2 tablets by mouth every 6 (six) hours as needed for moderate pain. 14 tablet 0  . LORazepam (ATIVAN) 0.5 MG tablet Take 0.5 mg by mouth every 8 (eight) hours as needed for anxiety.    . naproxen (NAPROSYN) 500 MG tablet Take 1 tablet (500 mg total) by mouth 2 (two) times daily. 14 tablet 0  . zolpidem (AMBIEN) 10 MG tablet   5  . zolpidem (AMBIEN) 5 MG tablet Take 5 mg by mouth at bedtime as needed for sleep.     No current facility-administered medications on file prior to visit.    Past Surgical History  Procedure Laterality Date  . Colonoscopy    . Appendectomy  2003  . Breast lumpectomy Right     benign  . Abdominal hysterectomy      Allergies  Allergen Reactions  . Codeine     History    Social History  . Marital Status: Married    Spouse Name: N/A  . Number of Children: N/A  . Years of Education: N/A   Occupational History  . Not on file.   Social History Main Topics  . Smoking status: Former Games developer  . Smokeless tobacco: Not on file     Comment: quit 2006-2007  . Alcohol Use: No  . Drug Use: No  . Sexual Activity: Not on file   Other Topics Concern  . Not on file   Social History Narrative    Family History  Problem Relation Age of Onset  . Colon cancer Paternal Grandfather   . Colon cancer Paternal Uncle   . Colon polyps Father   . Colon polyps Paternal Grandfather   . Colon polyps Paternal Uncle   . Diabetes Paternal Grandfather   . Diabetes Paternal Grandmother   . Diabetes Maternal Grandmother   . Diabetes Maternal Grandfather   . Heart disease Paternal Grandfather   . Irritable bowel syndrome Paternal Grandmother     BP 101/64 mmHg  Pulse 62  Ht  (1.702 m)  Wt 180 lb (81.647 kg)  BMI 28.19 kg/m2  LMP 08/20/2012  Review of Systems: See HPI above.    Objective:  Physical Exam:  Gen: NAD  Right foot/ankle: Mild lateral ankle swelling.  No bruising, deformity.  FROM. Mild TTP over ATFL.  No tenderness over cuboid, medial cuneiform. 1+ ant drawer and negative talar tilt.   Negative syndesmotic compression. Thompsons test negative. NV intact distally.    Assessment & Plan:  1. Right ankle injury - consistent with sprain based on exam, location of pain.  Minor setback 2 weeks ago.  She would like to continue with ASO, home exercises, icing, nsaids.  Call us if she would like to do PT.  F/u in 6 weeks.

## 2016-01-15 ENCOUNTER — Other Ambulatory Visit: Payer: Self-pay | Admitting: Obstetrics and Gynecology

## 2016-01-15 DIAGNOSIS — N63 Unspecified lump in unspecified breast: Secondary | ICD-10-CM

## 2016-01-19 ENCOUNTER — Other Ambulatory Visit: Payer: Self-pay | Admitting: Obstetrics and Gynecology

## 2016-01-19 ENCOUNTER — Ambulatory Visit
Admission: RE | Admit: 2016-01-19 | Discharge: 2016-01-19 | Disposition: A | Discharge status: Home / Self Care | Payer: BC Managed Care – PPO | Source: Ambulatory Visit | Attending: Obstetrics and Gynecology | Admitting: Obstetrics and Gynecology

## 2016-01-19 DIAGNOSIS — N63 Unspecified lump in unspecified breast: Secondary | ICD-10-CM

## 2017-05-02 DIAGNOSIS — N63 Unspecified lump in unspecified breast: Secondary | ICD-10-CM

## 2017-05-02 HISTORY — DX: Unspecified lump in unspecified breast: N63.0

## 2017-05-30 ENCOUNTER — Other Ambulatory Visit: Payer: Self-pay | Admitting: Obstetrics and Gynecology

## 2017-05-30 DIAGNOSIS — N6320 Unspecified lump in the left breast, unspecified quadrant: Secondary | ICD-10-CM

## 2017-05-30 DIAGNOSIS — N6310 Unspecified lump in the right breast, unspecified quadrant: Secondary | ICD-10-CM

## 2017-06-02 ENCOUNTER — Other Ambulatory Visit: Payer: Self-pay | Admitting: Obstetrics and Gynecology

## 2017-06-02 ENCOUNTER — Ambulatory Visit
Admission: RE | Admit: 2017-06-02 | Discharge: 2017-06-02 | Disposition: A | Discharge status: Home / Self Care | Payer: BC Managed Care – PPO | Source: Ambulatory Visit | Attending: Obstetrics and Gynecology | Admitting: Obstetrics and Gynecology

## 2017-06-02 DIAGNOSIS — N6320 Unspecified lump in the left breast, unspecified quadrant: Secondary | ICD-10-CM

## 2017-06-02 DIAGNOSIS — N6310 Unspecified lump in the right breast, unspecified quadrant: Secondary | ICD-10-CM

## 2017-06-02 HISTORY — DX: Unspecified lump in unspecified breast: N63.0

## 2017-06-12 ENCOUNTER — Other Ambulatory Visit: Payer: Self-pay | Admitting: Obstetrics and Gynecology

## 2017-06-12 DIAGNOSIS — N6320 Unspecified lump in the left breast, unspecified quadrant: Secondary | ICD-10-CM

## 2017-12-04 ENCOUNTER — Other Ambulatory Visit: Payer: BC Managed Care – PPO

## 2017-12-08 ENCOUNTER — Ambulatory Visit
Admission: RE | Admit: 2017-12-08 | Discharge: 2017-12-08 | Disposition: A | Discharge status: Home / Self Care | Payer: BC Managed Care – PPO | Source: Ambulatory Visit | Attending: Obstetrics and Gynecology | Admitting: Obstetrics and Gynecology

## 2017-12-08 ENCOUNTER — Other Ambulatory Visit: Payer: Self-pay | Admitting: Obstetrics and Gynecology

## 2017-12-08 DIAGNOSIS — N6320 Unspecified lump in the left breast, unspecified quadrant: Secondary | ICD-10-CM

## 2017-12-08 DIAGNOSIS — N63 Unspecified lump in unspecified breast: Secondary | ICD-10-CM

## 2018-06-11 ENCOUNTER — Ambulatory Visit
Admission: RE | Admit: 2018-06-11 | Discharge: 2018-06-11 | Disposition: A | Discharge status: Home / Self Care | Payer: BC Managed Care – PPO | Source: Ambulatory Visit | Attending: Obstetrics and Gynecology | Admitting: Obstetrics and Gynecology

## 2018-06-11 DIAGNOSIS — N63 Unspecified lump in unspecified breast: Secondary | ICD-10-CM

## 2019-06-26 ENCOUNTER — Other Ambulatory Visit: Payer: Self-pay | Admitting: Obstetrics and Gynecology

## 2019-06-26 DIAGNOSIS — N6320 Unspecified lump in the left breast, unspecified quadrant: Secondary | ICD-10-CM

## 2019-07-01 ENCOUNTER — Telehealth: Payer: Self-pay

## 2019-07-01 DIAGNOSIS — Z20822 Contact with and (suspected) exposure to covid-19: Secondary | ICD-10-CM

## 2019-07-01 NOTE — Telephone Encounter (Signed)
rec'd referral from Dr. Andrew Au office for COVID testing, due to being symptomatic.  Called pt. Scheduled appt. Tomorrow at 1:45 PM at the [place] site.  Advised to wear a mask and to remain in the car for the testing.  Verb. Understanding.

## 2019-07-02 ENCOUNTER — Other Ambulatory Visit: Payer: BC Managed Care – PPO

## 2019-07-02 DIAGNOSIS — Z20822 Contact with and (suspected) exposure to covid-19: Secondary | ICD-10-CM

## 2019-07-03 ENCOUNTER — Other Ambulatory Visit: Payer: BC Managed Care – PPO

## 2019-07-06 LAB — NOVEL CORONAVIRUS, NAA: SARS-CoV-2, NAA: NOT DETECTED

## 2019-07-22 ENCOUNTER — Other Ambulatory Visit: Payer: Self-pay

## 2019-07-22 ENCOUNTER — Ambulatory Visit
Admission: RE | Admit: 2019-07-22 | Discharge: 2019-07-22 | Disposition: A | Discharge status: Home / Self Care | Payer: BC Managed Care – PPO | Source: Ambulatory Visit | Attending: Obstetrics and Gynecology | Admitting: Obstetrics and Gynecology

## 2019-07-22 DIAGNOSIS — N6320 Unspecified lump in the left breast, unspecified quadrant: Secondary | ICD-10-CM

## 2020-08-18 ENCOUNTER — Other Ambulatory Visit: Payer: BC Managed Care – PPO

## 2020-08-18 ENCOUNTER — Other Ambulatory Visit: Payer: Self-pay

## 2020-08-18 ENCOUNTER — Other Ambulatory Visit: Payer: Self-pay | Admitting: *Deleted

## 2020-08-18 DIAGNOSIS — Z20822 Contact with and (suspected) exposure to covid-19: Secondary | ICD-10-CM

## 2020-08-19 LAB — NOVEL CORONAVIRUS, NAA: SARS-CoV-2, NAA: NOT DETECTED

## 2020-08-19 LAB — SARS-COV-2, NAA 2 DAY TAT

## 2022-05-18 ENCOUNTER — Other Ambulatory Visit: Payer: Self-pay | Admitting: Orthopedic Surgery

## 2022-05-18 DIAGNOSIS — M4722 Other spondylosis with radiculopathy, cervical region: Secondary | ICD-10-CM

## 2022-05-18 DIAGNOSIS — M50122 Cervical disc disorder at C5-C6 level with radiculopathy: Secondary | ICD-10-CM

## 2022-05-25 ENCOUNTER — Ambulatory Visit
Admission: RE | Admit: 2022-05-25 | Discharge: 2022-05-25 | Disposition: A | Discharge status: Home / Self Care | Payer: BC Managed Care – PPO | Source: Ambulatory Visit | Attending: Orthopedic Surgery | Admitting: Orthopedic Surgery

## 2022-05-25 DIAGNOSIS — M4722 Other spondylosis with radiculopathy, cervical region: Secondary | ICD-10-CM

## 2022-05-25 DIAGNOSIS — M50122 Cervical disc disorder at C5-C6 level with radiculopathy: Secondary | ICD-10-CM

## 2022-06-27 IMAGING — MR MR CERVICAL SPINE W/O CM
4 of 5 series · 27 of 48 positions shown · non-contrast
Comparison: Prior radiograph from 08/24/2012.

CLINICAL DATA: Initial evaluation for degenerative disc disease at
C5-6 with radiculopathy.

EXAM:
MRI CERVICAL SPINE WITHOUT CONTRAST
TECHNIQUE: Multiplanar, multisequence MR imaging of the cervical spine was
performed. No intravenous contrast was administered.

[Series 5: T2 · sagittal · 3.0mm · 0.55mm/px · 6 of 13 slices shown (1 of 2)]
[im 1/13]
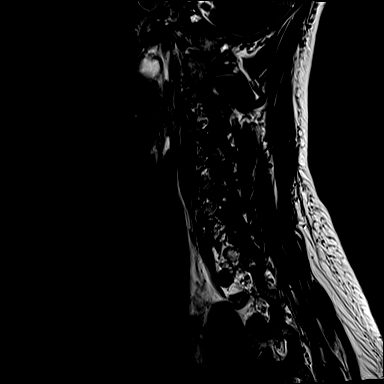
[im 3/13]
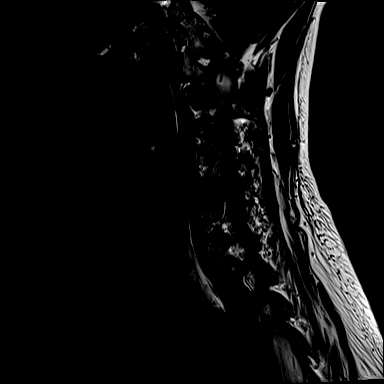
[im 5/13]
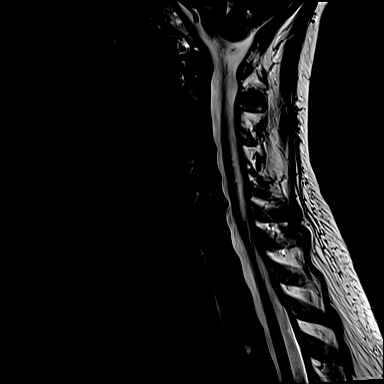
[im 8/13]
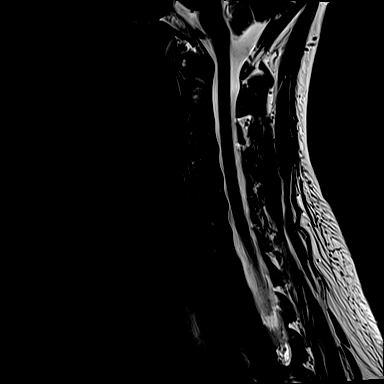
[im 10/13]
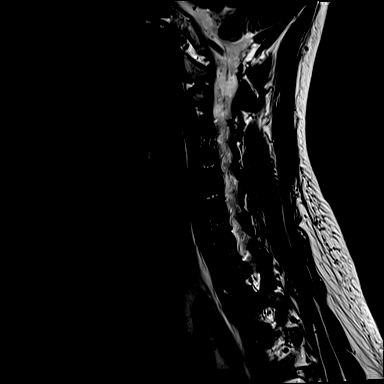
[im 13/13]
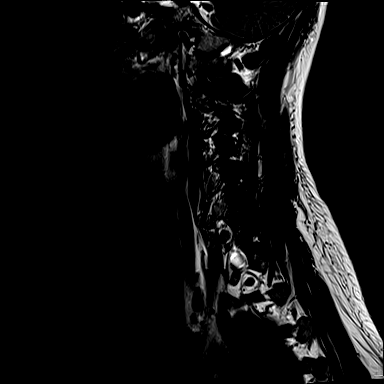

[Series 6: T1 · sagittal · 3.0mm · 0.66mm/px · 6 of 13 slices shown]
[im 1/13]
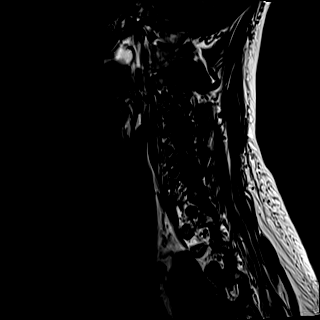
[im 3/13]
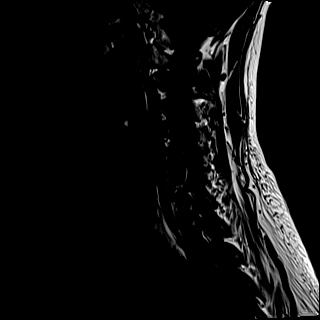
[im 5/13]
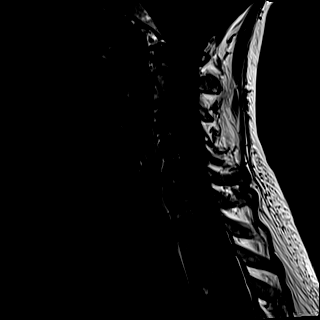
[im 8/13]
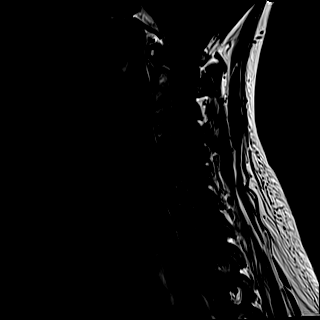
[im 10/13]
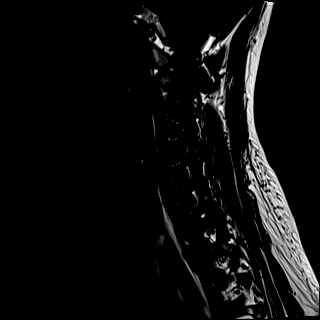
[im 13/13]
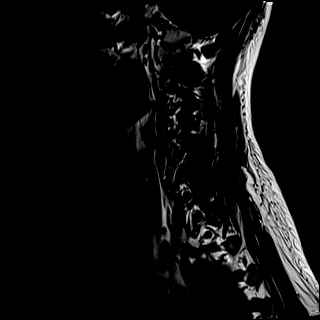

[Series 7: STIR · sagittal · 3.0mm · 0.33mm/px · 6 of 13 slices shown]
[im 1/13]
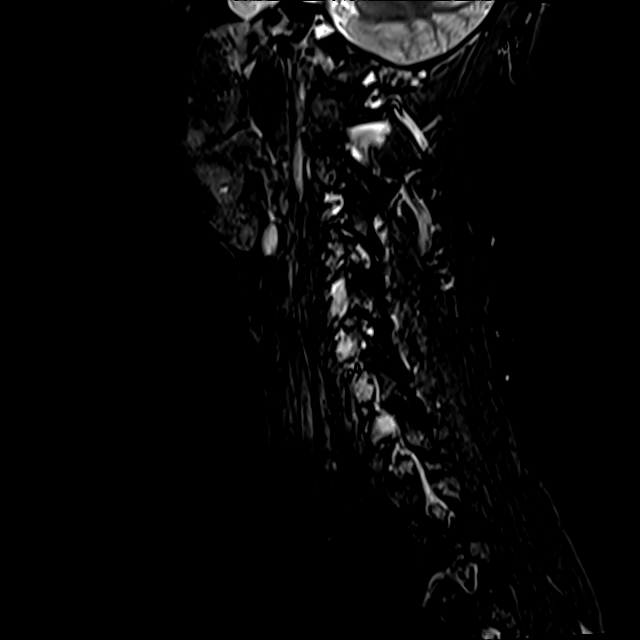
[im 3/13]
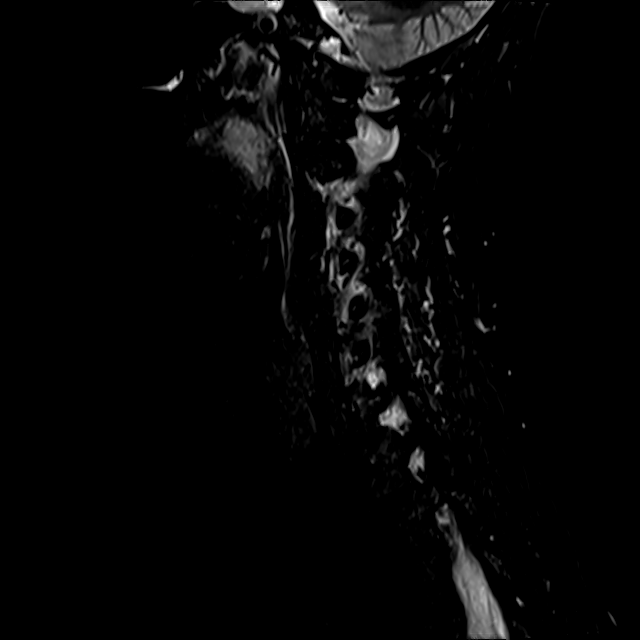
[im 5/13]
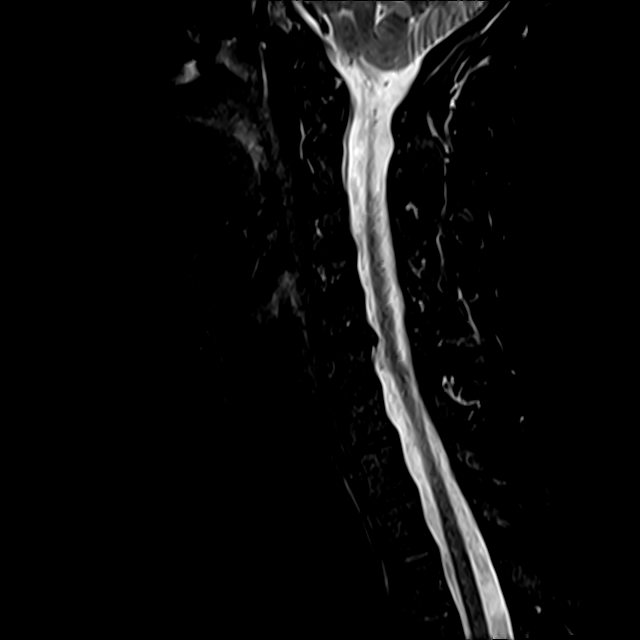
[im 8/13]
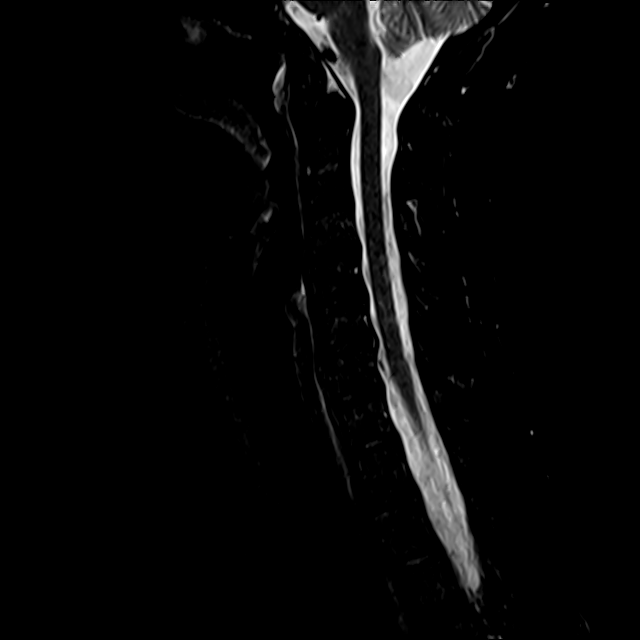
[im 10/13]
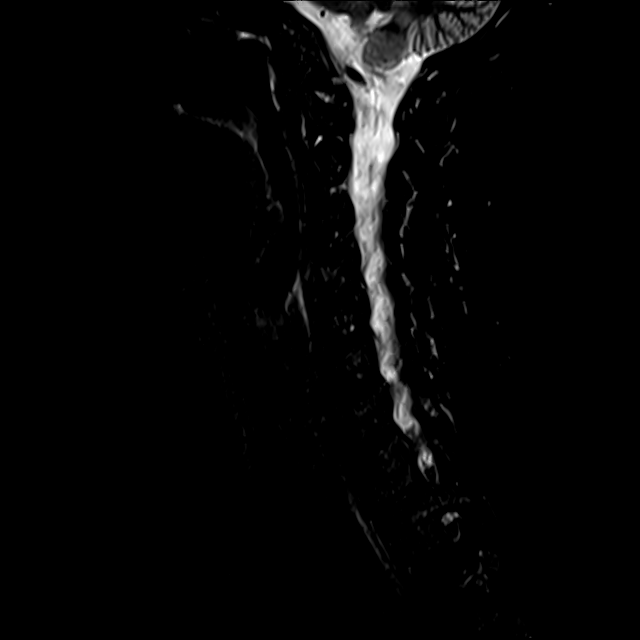
[im 13/13]
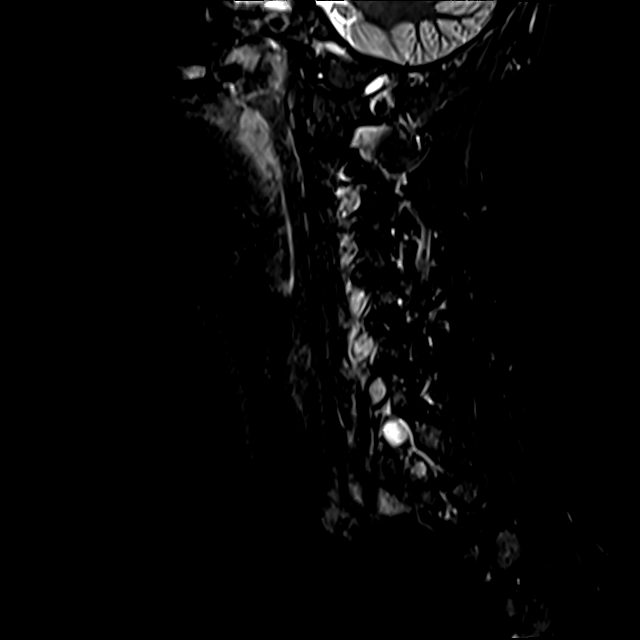

[Series 8: T2 · axial · 3.0mm · 0.50mm/px · z∈[-21,+85]mm · 9 of 34 slices shown (2 of 2)]
[im 1/34]
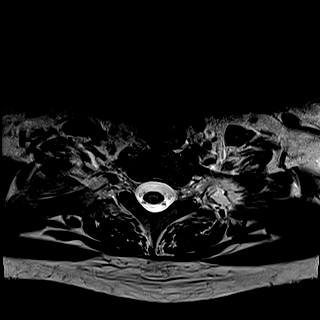
[im 5/34]
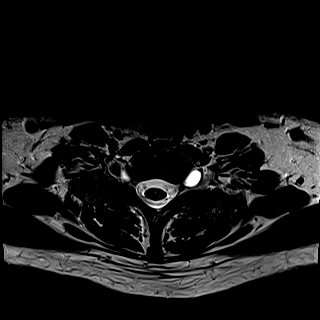
[im 10/34]
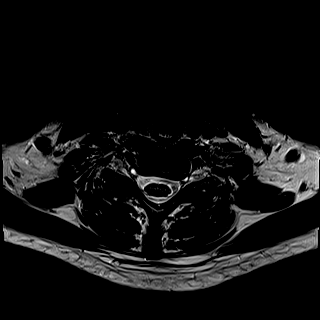
[im 15/34]
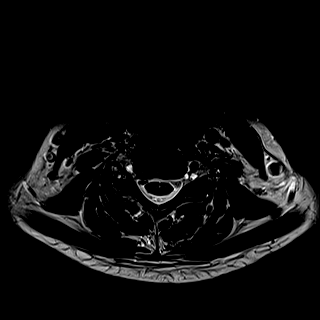
[im 17/34]
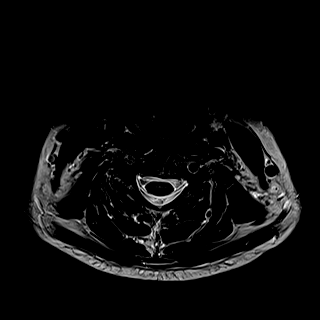
[im 19/34]
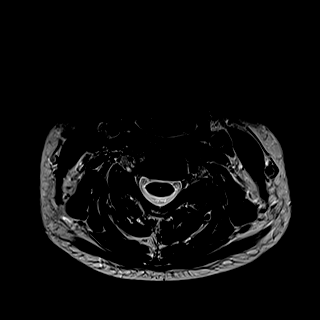
[im 24/34]
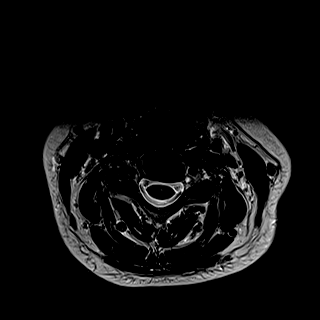
[im 29/34]
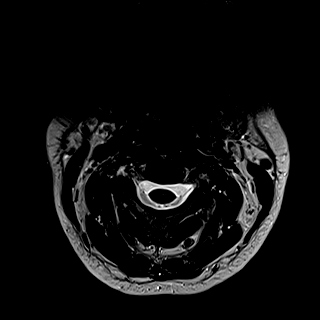
[im 34/34]
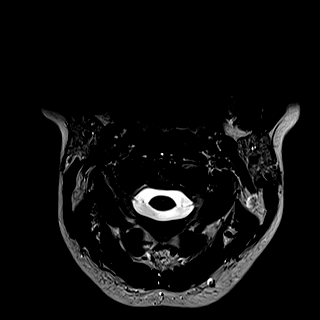

[27 of 48 positions shown; findings below may reference images not displayed]

FINDINGS: Alignment: Straightening of the normal cervical lordosis. No
listhesis.

Vertebrae: Vertebral body height maintained without acute or chronic
fracture. Bone marrow signal intensity within normal limits. Small
benign hemangioma noted within the T3 vertebral body. No worrisome
osseous lesions. Mild reactive marrow edema present about the right
C4-5 facet due to facet arthritis. No other abnormal marrow edema.

Cord: Normal signal and morphology.

Posterior Fossa, vertebral arteries, paraspinal tissues:
Unremarkable.

Disc levels:

C2-C3: Negative interspace. Mild right-sided facet hypertrophy. No
canal or foraminal stenosis.

C3-C4: Mild disc bulge with uncovertebral spurring. Moderate
right-sided facet hypertrophy. No canal or foraminal stenosis.

C4-C5: Mild disc bulge with uncovertebral spurring. Right-sided
facet hypertrophy with associated mild reactive marrow edema. No
spinal stenosis. Foramina remain patent.

C5-C6: Mild intervertebral disc space narrowing with diffuse disc
bulge, asymmetric to the right. Associated right greater than left
uncovertebral spurring and facet hypertrophy. Mild flattening of the
ventral thecal sac without significant spinal stenosis. Moderate
right C6 foraminal narrowing. Left neural foramen remains patent.

C6-C7: Minimal disc bulge. Mild bilateral facet hypertrophy. No
spinal stenosis. Mild left C7 foraminal narrowing. Right neural
foramen remains patent.

C7-T1: Negative interspace. Small perineural cyst noted at the
neural foramina bilaterally. No stenosis.

Visualized upper thoracic spine demonstrates no significant finding.
IMPRESSION: 1. Right eccentric disc bulge with uncovertebral and facet
hypertrophy at C5-6 with resultant moderate right C6 foraminal
stenosis.
2. Additional mild noncompressive disc bulging elsewhere within the
cervical spine as above. No other significant stenosis or
impingement.
3. Multilevel facet hypertrophy as above, overall greater on the
right. Associated mild reactive marrow edema about the right C4-5
facet due to facet arthritis. Findings could serve as a source for
neck pain.

## 2022-09-25 HISTORY — PX: OTHER SURGICAL HISTORY: SHX169

## 2023-03-24 ENCOUNTER — Other Ambulatory Visit: Payer: Self-pay | Admitting: Internal Medicine

## 2023-03-24 DIAGNOSIS — M4322 Fusion of spine, cervical region: Secondary | ICD-10-CM

## 2023-03-24 DIAGNOSIS — M4722 Other spondylosis with radiculopathy, cervical region: Secondary | ICD-10-CM

## 2023-03-24 DIAGNOSIS — M50122 Cervical disc disorder at C5-C6 level with radiculopathy: Secondary | ICD-10-CM

## 2023-03-24 DIAGNOSIS — M5412 Radiculopathy, cervical region: Secondary | ICD-10-CM

## 2023-03-30 ENCOUNTER — Encounter: Payer: Self-pay | Admitting: Internal Medicine

## 2023-03-31 ENCOUNTER — Ambulatory Visit
Admission: RE | Admit: 2023-03-31 | Discharge: 2023-03-31 | Disposition: A | Discharge status: Home / Self Care | Payer: BC Managed Care – PPO | Source: Ambulatory Visit | Attending: Internal Medicine | Admitting: Internal Medicine

## 2023-03-31 DIAGNOSIS — M5412 Radiculopathy, cervical region: Secondary | ICD-10-CM

## 2023-03-31 DIAGNOSIS — M4322 Fusion of spine, cervical region: Secondary | ICD-10-CM

## 2023-03-31 DIAGNOSIS — M4722 Other spondylosis with radiculopathy, cervical region: Secondary | ICD-10-CM

## 2023-03-31 DIAGNOSIS — M50122 Cervical disc disorder at C5-C6 level with radiculopathy: Secondary | ICD-10-CM

## 2023-08-01 ENCOUNTER — Ambulatory Visit: Payer: BC Managed Care – PPO | Admitting: Neurology

## 2023-08-01 ENCOUNTER — Encounter: Payer: Self-pay | Admitting: Neurology

## 2023-08-01 VITALS — BP 115/71 | HR 65 | Ht 65.5 in | Wt 217.0 lb

## 2023-08-01 DIAGNOSIS — R4701 Aphasia: Secondary | ICD-10-CM

## 2023-08-01 DIAGNOSIS — R5382 Chronic fatigue, unspecified: Secondary | ICD-10-CM

## 2023-08-01 DIAGNOSIS — G441 Vascular headache, not elsewhere classified: Secondary | ICD-10-CM

## 2023-08-01 DIAGNOSIS — H539 Unspecified visual disturbance: Secondary | ICD-10-CM

## 2023-08-01 DIAGNOSIS — R278 Other lack of coordination: Secondary | ICD-10-CM

## 2023-08-01 DIAGNOSIS — R400 Somnolence: Secondary | ICD-10-CM

## 2023-08-01 DIAGNOSIS — M542 Cervicalgia: Secondary | ICD-10-CM | POA: Diagnosis not present

## 2023-08-01 DIAGNOSIS — R519 Headache, unspecified: Secondary | ICD-10-CM

## 2023-08-01 DIAGNOSIS — G8929 Other chronic pain: Secondary | ICD-10-CM

## 2023-08-01 DIAGNOSIS — E6609 Other obesity due to excess calories: Secondary | ICD-10-CM

## 2023-08-01 DIAGNOSIS — Z981 Arthrodesis status: Secondary | ICD-10-CM

## 2023-08-01 DIAGNOSIS — H4710 Unspecified papilledema: Secondary | ICD-10-CM

## 2023-08-01 DIAGNOSIS — R2689 Other abnormalities of gait and mobility: Secondary | ICD-10-CM

## 2023-08-01 DIAGNOSIS — G43711 Chronic migraine without aura, intractable, with status migrainosus: Secondary | ICD-10-CM

## 2023-08-01 DIAGNOSIS — R51 Headache with orthostatic component, not elsewhere classified: Secondary | ICD-10-CM

## 2023-08-01 DIAGNOSIS — R4189 Other symptoms and signs involving cognitive functions and awareness: Secondary | ICD-10-CM

## 2023-08-01 DIAGNOSIS — H5713 Ocular pain, bilateral: Secondary | ICD-10-CM

## 2023-08-01 MED ORDER — NURTEC 75 MG PO TBDP
ORAL_TABLET | ORAL | Status: DC
Start: 2023-08-01 — End: 2024-01-29

## 2023-08-01 MED ORDER — METHYLPREDNISOLONE 4 MG PO TBPK: ORAL_TABLET | ORAL | 1 refills | Status: DC

## 2023-08-01 MED ORDER — RIZATRIPTAN BENZOATE 10 MG PO TBDP: 10.0000 mg | ORAL_TABLET | ORAL | 11 refills | Status: DC | PRN

## 2023-08-01 MED ORDER — ONDANSETRON 4 MG PO TBDP: 4.0000 mg | ORAL_TABLET | Freq: Three times a day (TID) | ORAL | 3 refills | Status: DC | PRN

## 2023-08-01 MED ORDER — AJOVY 225 MG/1.5ML ~~LOC~~ SOAJ
225.0000 mg | SUBCUTANEOUS | 11 refills | Status: DC
Start: 2023-08-01 — End: 2024-01-29

## 2023-08-01 NOTE — Progress Notes (Signed)
[facility]    Provider:  Dr Lucia Gaskins Requesting Provider: Gwynn Burly* Primary Care Provider:  Blair Heys, MD  CC:    HPI:  Hayley Hamilton is a 47 y.o. female here as requested by Gwynn Burly* for migraines. has ABDOMINAL PAIN, LEFT LOWER QUADRANT; DOE (dyspnea on exertion); Orthostatic hypotension; and Right ankle injury on their problem list.  2 years ago she had neck pain, went to spine and had acdf. Started having headaches then starte to be migraines in 2023. Debilitating. She has a constant low grade pain. On top of that become moderate to severe, pulsating/pounding/throbbing/constant, usually behind the eyes, photo/phonophobia, nausea, smells can make worse or trigger, vomiting, hurts to move, leayes in a dark room. Motion sensitivity. Had acdf but still myofascial muscle cervical pain remains, she went back to work Nov 2023, daily headaches and daily myofascial neck pain. Tried dry needling, went to PT, ice, heating, failed conservative measures. Daily headaches. She misses work often. 16 moderate to severe migraine days a month, last up to 72 hours, sleep helps but wakes up with it, she has blurry vision and vision changes but 3 vision to optho and nothing found, she has impaired coordination, imbalance, difficulty communication, aphasia getting words out, cognitive deficits, fatigue, morning headaches. Worse in the morning when waking. No snoring. Wakes with dry mouth. Wakes often. She never wake up refreshed. Never had sleep apnea testing, mother has sleep apnea, she has gained 50+ pounds.Noaura, no medication overuse, ongoing at this freq and severity for > 1 year, affecting life and work. Also neck pain. No other focal neurologic deficits, associated symptoms, inciting events or modifiable factors.   Reviewed notes, labs and imaging from outside physicians, which showed:   From a thorough review of records, medications tried greater than  3 months include: Tried topiramate (side effects could not tolerate), BP medications contraindicated due to hypotension, gabapentin, lyrica, robaxin, naproxen lexapro, cannot take amirtiptyline or nortriptyline because she is currently on another seratonergic agent lexapro and combination can cause seratonin syndrome, sumatriptan, rizatriptan  MRI cervical spine 03/2023: FINDINGS: Alignment: Chronic straightening of the normal cervical lordosis. No significant listhesis.   Vertebrae: No fracture, evidence of discitis, or bone lesion. Prior C5-C6 ACDF.   Cord: Normal signal and morphology.   Posterior Fossa, vertebral arteries, paraspinal tissues: Negative.   Disc levels:   C2-C3: Negative disc. Mild bilateral facet arthropathy. No stenosis.   C3-C4: Unchanged minimal disc bulging with moderate right and mild left facet arthropathy. No stenosis.   C4-C5: Unchanged mild disc bulging and bilateral facet arthropathy. No stenosis.   C5-C6: Interval ACDF. Moderate right facet arthropathy. No residual stenosis.   C6-C7: Unchanged mild disc bulging and moderate bilateral facet arthropathy. Unchanged borderline mild left neuroforaminal stenosis. No spinal canal or right neuroforaminal stenosis.   C7-T1: Negative disc. Unchanged mild to moderate left facet arthropathy. No stenosis.   IMPRESSION: 1. Interval C5-C6 ACDF without residual stenosis. 2. Unchanged mild multilevel cervical spondylosis as described above without high-grade stenosis or impingement.  Review of Systems: Patient complains of symptoms per HPI as well as the following symptoms migraine. Pertinent negatives and positives per HPI. All others negative.   Social History   Socioeconomic History   Marital status: Married    Spouse name: Not on file   Number of children: Not on file   Years of education: Not on file   Highest education level: Not on file  Occupational History   Not on file  Tobacco Use   Smoking  status: Former   Smokeless tobacco: Never   Tobacco comments:    quit 2006-2007  Vaping Use   Vaping status: Never Used  Substance and Sexual Activity   Alcohol use: Yes    Alcohol/week: 1.0 - 2.0 standard drink of alcohol    Types: 1 - 2 Standard drinks or equivalent per week    Comment: hasn't had any in 2 months, but otherwise 1-2 drinks per week   Drug use: No   Sexual activity: Not on file  Other Topics Concern   Not on file  Social History Narrative   Caffeine: 2 cups coffee in the morning   Right handed   Social Determinants of Health   Financial Resource Strain: Not on file  Food Insecurity: Not on file  Transportation Needs: Not on file  Physical Activity: Not on file  Stress: Not on file  Social Connections: Not on file  Intimate Partner Violence: Not on file    Family History  Problem Relation Age of Onset   Colon polyps Father    Diabetes Maternal Grandmother    Diabetes Maternal Grandfather    Diabetes Paternal Grandmother    Irritable bowel syndrome Paternal Grandmother    Colon cancer Paternal Grandfather    Colon polyps Paternal Grandfather    Diabetes Paternal Grandfather    Heart disease Paternal Grandfather    Colon cancer Paternal Uncle    Colon polyps Paternal Uncle    Migraines Neg Hx     Past Medical History:  Diagnosis Date   Breast mass 05/02/2017   bilat breast masses and lt axilla    Patient Active Problem List   Diagnosis Date Noted   Right ankle injury 04/29/2015   DOE (dyspnea on exertion) 12/03/2013   Orthostatic hypotension 12/03/2013   ABDOMINAL PAIN, LEFT LOWER QUADRANT 07/03/2008    Past Surgical History:  Procedure Laterality Date   ABDOMINAL HYSTERECTOMY     ACDF  09/2022   APPENDECTOMY  2003   BREAST EXCISIONAL BIOPSY Right    COLONOSCOPY      Current Outpatient Medications  Medication Sig Dispense Refill   escitalopram (LEXAPRO) 20 MG tablet Take 20 mg by mouth daily.     Fremanezumab-vfrm (AJOVY) 225  MG/1.5ML SOAJ Inject 225 mg into the skin every 30 (thirty) days. Please run copay card: BIN# [id number] PCN# [id number] GRP# [id number] ID# [id number] 1.5 mL 11   gabapentin (NEURONTIN) 600 MG tablet Take 600 mg by mouth 3 (three) times daily.     LORazepam (ATIVAN) 0.5 MG tablet Take 0.5 mg by mouth every 8 (eight) hours as needed for anxiety.     methocarbamol (ROBAXIN) 500 MG tablet Take 500 mg by mouth 3 (three) times daily as needed.     methylPREDNISolone (MEDROL DOSEPAK) 4 MG TBPK tablet Take pills daily all together with food. Take the first dose (6 pills) as soon as possible. Take the rest each morning. For 6 days total 6-5-4-3-2-1. 21 tablet 1   ondansetron (ZOFRAN-ODT) 4 MG disintegrating tablet Take 1-2 tablets (4-8 mg total) by mouth every 8 (eight) hours as needed. 30 tablet 3   Rimegepant Sulfate (NURTEC) 75 MG TBDP Take one daily for 8 days or every other day if feeling well     rizatriptan (MAXALT-MLT) 10 MG disintegrating tablet Take 1 tablet (10 mg total) by mouth as needed for migraine. May repeat in 2 hours if needed 9 tablet 11   topiramate (TOPAMAX) 25 MG tablet  Take 25 mg by mouth at bedtime.     zolpidem (AMBIEN) 10 MG tablet   5   No current facility-administered medications for this visit.    Allergies as of 08/01/2023 - Review Complete 08/01/2023  Allergen Reaction Noted   Codeine      Vitals: BP 115/71 (BP Location: Right Arm, Patient Position: Sitting)   Pulse 65   Ht 5' 5.5" (1.664 m)   Wt 217 lb (98.4 kg)   LMP 08/20/2012   BMI 35.56 kg/m  Last Weight:  Wt Readings from Last 1 Encounters:  08/01/23 217 lb (98.4 kg)   Last Height:   Ht Readings from Last 1 Encounters:  08/01/23 5' 5.5" (1.664 m)     Physical exam: Exam: Gen: NAD, conversant, well nourised, obese, well groomed                     CV: RRR, no MRG. No Carotid Bruits. No peripheral edema, warm, nontender Eyes: Conjunctivae clear without exudates or hemorrhage  Neuro: Detailed Neurologic  Exam  Speech:    Speech is normal; fluent and spontaneous with normal comprehension.  Cognition:    The patient is oriented to person, place, and time;     recent and remote memory intact;     language fluent;     normal attention, concentration,     fund of knowledge Cranial Nerves:    The pupils are equal, round, and reactive to light. The fundi are normal and spontaneous venous pulsations are present. Visual fields are full to finger confrontation. Extraocular movements are intact. Trigeminal sensation is intact and the muscles of mastication are normal. The face is symmetric. The palate elevates in the midline. Hearing intact. Voice is normal. Shoulder shrug is normal. The tongue has normal motion without fasciculations.   Coordination:    Normal finger to nose and heel to shin. Normal rapid alternating movements.   Gait: nml  Motor Observation: nml Tone:    Normal muscle tone.    Posture:    Posture is normal. normal erect    Strength:    Strength is V/V in the upper and lower limbs.      Sensation: intact to LT     Reflex Exam:  DTR's:    Deep tendon reflexes in the upper and lower extremities are normal bilaterally.   Toes:    The toes are downgoing bilaterally.   Clonus:    Clonus is absent.    Assessment/Plan:  Patient with chronic migraines, morning headaches,, needs thorough evaluation due to: Chronic migraine without aura, with intractable migraine, so stated, with status migrainosus. Diagnoses of Chronic neck pain, Hx of fusion of cervical spine, Chronic daily headache, Positional headache, Vision changes, Worsening headaches, Papilledema, Imbalance, Impaired coordination of upper extremity, Aphasia, Subjective memory complaints, Morning headache, Class 2 obesity due to excess calories with body mass index (BMI) of 35.0 to 35.9 in adult, unspecified whether serious comorbidity present, Daytime somnolence, and Chronic fatigue were also pertinent to this  visit.   Sleep evaluation: morning headaches. Worse in the morning when waking. No snoring. Wakes with dry mouth. Wakes often. She never wake up refreshed. Never had sleep apnea testing, mother has sleep apnea, she has gained 50+ pounds.  MRI brain w/wo contrast: MRI brain due to concerning symptoms of Chronic daily headache, Positional headache, Vision changes, Worsening headaches, Papilledema, Imbalance, Impaired coordination of upper extremity, Aphasia, Subjective memory complaints, Morning headache,  Daytime somnolence, and Chronic fatigue  to look for space occupying mass, chiari or intracranial hypertension (pseudotumor), strokes, malignancies, vasculidities, demyelination(multiple sclerosis) or other. MRI orbits due to papilledema to evaluate cavernous sinus lesions or globe lesions.   Migraines:   Acute management: Rizatriptan: Please take one tablet at the onset of your headache. If it does not improve the symptoms please take one additional tablet. Do not take more then 2 tablets in 24hrs. Do not take use more then 2 to 3 times in a week.Can take with ondansetron.  Try Nurtec: Once daily as needed - lada gaga - to bridge also a medrol dosepak Bridge with a medrol dosepak - steroid Nausea: Ondansetron - take one pill with Rizatriptan or nurtec or take alone  Preventative:   Ajovy : or emgality. Other options are vyepti or botox.   Orders Placed This Encounter  Procedures   MR BRAIN W WO CONTRAST   MR ORBITS W WO CONTRAST   CBC with Differential/Platelets   Comprehensive metabolic panel   TSH Rfx on Abnormal to Free T4   Ambulatory referral to Sleep Studies   Meds ordered this encounter  Medications   rizatriptan (MAXALT-MLT) 10 MG disintegrating tablet    Sig: Take 1 tablet (10 mg total) by mouth as needed for migraine. May repeat in 2 hours if needed    Dispense:  9 tablet    Refill:  11   methylPREDNISolone (MEDROL DOSEPAK) 4 MG TBPK tablet    Sig: Take pills daily all  together with food. Take the first dose (6 pills) as soon as possible. Take the rest each morning. For 6 days total 6-5-4-3-2-1.    Dispense:  21 tablet    Refill:  1   ondansetron (ZOFRAN-ODT) 4 MG disintegrating tablet    Sig: Take 1-2 tablets (4-8 mg total) by mouth every 8 (eight) hours as needed.    Dispense:  30 tablet    Refill:  3   Rimegepant Sulfate (NURTEC) 75 MG TBDP    Sig: Take one daily for 8 days or every other day if feeling well   Fremanezumab-vfrm (AJOVY) 225 MG/1.5ML SOAJ    Sig: Inject 225 mg into the skin every 30 (thirty) days. Please run copay card: BIN# [id number] PCN# [id number] GRP# [id number] ID# [id number]    Dispense:  1.5 mL    Refill:  11    Please run copay card: BIN# [id number] PCN# [id number] GRP# [id number] ID# [id number]    Cc: Brock Ra, MD  Naomie Dean, MD  [facility] [address] [place], [place] [address]  Phone [phone] Fax [phone]

## 2023-08-01 NOTE — Patient Instructions (Addendum)
Acute management: Rizatriptan: Please take one tablet at the onset of your headache. If it does not improve the symptoms please take one additional tablet. Do not take more then 2 tablets in 24hrs. Do not take use more then 2 to 3 times in a week.Can take with ondansetron.  Try Nurtec: Once daily as needed - [place] with a medrol dosepak - steroid Nausea: Ondansetron - take one pill with Rizatriptan or nurtec or take alone  Preventative:   Ajovy : or emgality. Other options are vyepti or botox.   MRI of the brain w/wo contrast  Fremanezumab Injection What is this medication? FREMANEZUMAB (fre ma NEZ ue mab) prevents migraines. It works by blocking a substance in the body that causes migraines. It is a monoclonal antibody. This medicine may be used for other purposes; ask your health care provider or pharmacist if you have questions. COMMON BRAND NAME(S): AJOVY What should I tell my care team before I take this medication? They need to know if you have any of these conditions: An unusual or allergic reaction to fremanezumab, other medications, foods, dyes, or preservatives Pregnant or trying to get pregnant Breast-feeding How should I use this medication? This medication is injected under the skin. You will be taught how to prepare and give it. Take it as directed on the prescription label. Keep taking it unless your care team tells you to stop. It is important that you put your used needles and syringes in a special sharps container. Do not put them in a trash can. If you do not have a sharps container, call your pharmacist or care team to get one. Talk to your care team about the use of this medication in children. Special care may be needed. Overdosage: If you think you have taken too much of this medicine contact a poison control center or emergency room at once. NOTE: This medicine is only for you. Do not share this medicine with others. What if I miss a dose? If you miss a  dose, take it as soon as you can. If it is almost time for your next dose, take only that dose. Do not take double or extra doses. What may interact with this medication? Interactions are not expected. This list may not describe all possible interactions. Give your health care provider a list of all the medicines, herbs, non-prescription drugs, or dietary supplements you use. Also tell them if you smoke, drink alcohol, or use illegal drugs. Some items may interact with your medicine. What should I watch for while using this medication? Tell your care team if your symptoms do not start to get better or if they get worse. What side effects may I notice from receiving this medication? Side effects that you should report to your care team as soon as possible: Allergic reactions or angioedema--skin rash, itching or hives, swelling of the face, eyes, lips, tongue, arms, or legs, trouble swallowing or breathing Side effects that usually do not require medical attention (report to your care team if they continue or are bothersome): Pain, redness, or irritation at injection site This list may not describe all possible side effects. Call your doctor for medical advice about side effects. You may report side effects to FDA at 1-800-FDA-1088. Where should I keep my medication? Keep out of the reach of children and pets. Store in a refrigerator or at room temperature between 20 and 25 degrees C (68 and 77 degrees F). Refrigeration (preferred): Store in the refrigerator. Do  not freeze. Keep in the original container until you are ready to take it. Remove the dose from the carton about 30 minutes before it is time for you to use it. If the dose is not used, it may be stored in the original container at room temperature for 7 days. Get rid of any unused medication after the expiration date. Room Temperature: This medication may be stored at room temperature for up to 7 days. Keep it in the original container.  Protect from light until time of use. If it is stored at room temperature, get rid of any unused medication after 7 days or after it expires, whichever is first. To get rid of medications that are no longer needed or have expired: Take the medication to a medication take-back program. Check with your pharmacy or law enforcement to find a location. If you cannot return the medication, ask your pharmacist or care team how to get rid of this medication safely. NOTE: This sheet is a summary. It may not cover all possible information. If you have questions about this medicine, talk to your doctor, pharmacist, or health care provider.  2024 Elsevier/Gold Standard (2022-02-04 00:00:00) Ondansetron Dissolving Tablets What is this medication? ONDANSETRON (on DAN se tron) prevents nausea and vomiting from chemotherapy, radiation, or surgery. It works by blocking substances in the body that may cause nausea or vomiting. It belongs to a group of medications called antiemetics. This medicine may be used for other purposes; ask your health care provider or pharmacist if you have questions. COMMON BRAND NAME(S): Zofran ODT What should I tell my care team before I take this medication? They need to know if you have any of these conditions: Heart disease Irregular heartbeat or rhythm Liver disease Low levels of magnesium or potassium in the blood An unusual or allergic reaction to ondansetron, other medications, foods, dyes, or preservatives Pregnant or trying to get pregnant Breastfeeding How should I use this medication? Take this medication by mouth. Take it as directed on the prescription label at the same time every day. You do not need water to take this medication. Leave the tablet in the sealed pack until you are ready to take it. With dry hands, open the pack and gently remove the tablet. Place the tablet in the mouth and allow it to dissolve. Then, swallow it. Talk to your care team about the use of  this medication in children. Special care may be needed. Overdosage: If you think you have taken too much of this medicine contact a poison control center or emergency room at once. NOTE: This medicine is only for you. Do not share this medicine with others. What if I miss a dose? If you miss a dose, take it as soon as you can. If it is almost time for your next dose, take only that dose. Do not take double or extra doses. What may interact with this medication? Do not take this medication with any of the following: Apomorphine Certain medications for fungal infections, such as fluconazole, ketoconazole, posaconazole Cisapride Dronedarone Levoketoconazole Pimozide Quinidine Thioridazine This medication may also interact with the following: Certain medications for depression, anxiety, or other mental health conditions Certain medications for migraines, such as sumatriptan Linezolid Methylene blue Opioids Other medications that cause heart rhythm changes, such as dofetilide or ziprasidone St. John's wort Stimulant medications for ADHD, weight loss, or staying awake Tryptophan This list may not describe all possible interactions. Give your health care provider a list of all the  medicines, herbs, non-prescription drugs, or dietary supplements you use. Also tell them if you smoke, drink alcohol, or use illegal drugs. Some items may interact with your medicine. What should I watch for while using this medication? Check with your care team as soon as you can if you have any sign of an allergic reaction. What side effects may I notice from receiving this medication? Side effects that you should report to your care team as soon as possible: Allergic reactions--skin rash, itching, hives, swelling of the face, lips, tongue, or throat Bowel blockage--stomach cramping, unable to have a bowel movement or pass gas, loss of appetite, vomiting Chest pain (angina)--pain, pressure, or tightness in the  chest, neck, back, or arms Heart rhythm changes--fast or irregular heartbeat, dizziness, feeling faint or lightheaded, chest pain, trouble breathing Irritability, confusion, fast or irregular heartbeat, muscle stiffness, twitching muscles, sweating, high fever, seizure, chills, vomiting, diarrhea, which may be signs of serotonin syndrome Side effects that usually do not require medical attention (report to your care team if they continue or are bothersome): Constipation Diarrhea General discomfort and fatigue Headache This list may not describe all possible side effects. Call your doctor for medical advice about side effects. You may report side effects to FDA at 1-800-FDA-1088. Where should I keep my medication? Keep out of the reach of children and pets. Store between 2 and 30 degrees C (36 and 86 degrees F). Throw away any unused medication after the expiration date. NOTE: This sheet is a summary. It may not cover all possible information. If you have questions about this medicine, talk to your doctor, pharmacist, or health care provider.  2024 Elsevier/Gold Standard (2023-02-15 00:00:00) Methylprednisolone Tablets What is this medication? METHYLPREDNISOLONE (meth ill pred NISS oh lone) treats many conditions such as asthma, allergic reactions, arthritis, inflammatory bowel diseases, adrenal, and blood or bone marrow disorders. It works by decreasing inflammation, slowing down an overactive immune system, or replacing cortisol normally made in the body. Cortisol is a hormone that plays an important role in how the body responds to stress, illness, and injury. It belongs to a group of medications called steroids. This medicine may be used for other purposes; ask your health care provider or pharmacist if you have questions. COMMON BRAND NAME(S): Medrol, Medrol Dosepak What should I tell my care team before I take this medication? They need to know if you have any of these  conditions: Cushing's syndrome Eye disease, vision problems Diabetes Glaucoma Heart disease High blood pressure Infection especially a viral infection, such as chickenpox, cold sores, or herpes Liver disease Mental health conditions Myasthenia gravis Osteoporosis Recent or upcoming vaccine Seizures Stomach or intestine problems Thyroid disease An unusual or allergic reaction to lactose, methylprednisolone, other medications, foods, dyes, or preservatives Pregnant or trying to get pregnant Breastfeeding How should I use this medication? Take this medication by mouth with a glass of water. Follow the directions on the prescription label. Take this medication with food. If you are taking this medication once a day, take it in the morning. Do not take it more often than directed. Do not suddenly stop taking your medication because you may develop a severe reaction. Your care team will tell you how much medication to take. If your care team wants you to stop the medication, the dose may be slowly lowered over time to avoid any side effects. Talk to your care team about the use of this medication in children. Special care may be needed. Overdosage: If you think  you have taken too much of this medicine contact a poison control center or emergency room at once. NOTE: This medicine is only for you. Do not share this medicine with others. What if I miss a dose? If you miss a dose, take it as soon as you can. If it is almost time for your next dose, talk to your care team. You may need to miss a dose or take an extra dose. Do not take double or extra doses without advice. What may interact with this medication? Do not take this medication with any of the following: Alefacept Echinacea Live virus vaccines Metyrapone Mifepristone This medication may also interact with the following: Amphotericin B Aspirin and aspirin-like medications Certain antibiotics, such as erythromycin, clarithromycin,  troleandomycin Certain medications for diabetes Certain medications for fungal infections, such as ketoconazole Certain medications for seizures, such as carbamazepine, phenobarbital, phenytoin Certain medications that treat or prevent blood clots, such as warfarin Cholestyramine Cyclosporine Digoxin Diuretics Estrogen or progestin hormones Isoniazid NSAIDs, medications for pain and inflammation, such as ibuprofen or naproxen Other medications for myasthenia gravis Rifampin Vaccines This list may not describe all possible interactions. Give your health care provider a list of all the medicines, herbs, non-prescription drugs, or dietary supplements you use. Also tell them if you smoke, drink alcohol, or use illegal drugs. Some items may interact with your medicine. What should I watch for while using this medication? Tell your care team if your symptoms do not start to get better or if they get worse. Do not stop taking except on your care team's advice. You may develop a severe reaction. Your care team will tell you how much medication to take. This medication may increase your risk of getting an infection. Tell your care team if you are around anyone with measles or chickenpox, or if you develop sores or blisters that do not heal properly. This medication may increase blood sugar levels. Ask your care team if changes in diet or medications are needed if you have diabetes. Tell your care team right away if you have any change in your eyesight. Using this medication for a long time may increase your risk of low bone mass. Talk to your care team about bone health. What side effects may I notice from receiving this medication? Side effects that you should report to your care team as soon as possible: Allergic reactions--skin rash, itching, hives, swelling of the face, lips, tongue, or throat Cushing syndrome--increased fat around the midsection, upper back, neck, or face, pink or purple stretch  marks on the skin, thinning, fragile skin that easily bruises, unexpected hair growth High blood sugar (hyperglycemia)--increased thirst or amount of urine, unusual weakness or fatigue, blurry vision Increase in blood pressure Infection--fever, chills, cough, sore throat, wounds that don't heal, pain or trouble when passing urine, general feeling of discomfort or being unwell Low adrenal gland function--nausea, vomiting, loss of appetite, unusual weakness or fatigue, dizziness Mood and behavior changes--anxiety, nervousness, confusion, hallucinations, irritability, hostility, thoughts of suicide or self-harm, worsening mood, feelings of depression Stomach bleeding--bloody or black, tar-like stools, vomiting blood or brown material that looks like coffee grounds Swelling of the ankles, hands, or feet Side effects that usually do not require medical attention (report to your care team if they continue or are bothersome): Acne General discomfort and fatigue Headache Increase in appetite Nausea Trouble sleeping Weight gain This list may not describe all possible side effects. Call your doctor for medical advice about side effects. You may  report side effects to FDA at 1-800-FDA-1088. Where should I keep my medication? Keep out of the reach of children and pets. Store at room temperature between 20 and 25 degrees C (68 and 77 degrees F). Throw away any unused medication after the expiration date. NOTE: This sheet is a summary. It may not cover all possible information. If you have questions about this medicine, talk to your doctor, pharmacist, or health care provider.  2024 Elsevier/Gold Standard (2022-08-10 00:00:00) Rimegepant Disintegrating Tablets What is this medication? RIMEGEPANT (ri ME je pant) prevents and treats migraines. It works by blocking a substance in the body that causes migraines. This medicine may be used for other purposes; ask your health care provider or pharmacist if you  have questions. COMMON BRAND NAME(S): NURTEC ODT What should I tell my care team before I take this medication? They need to know if you have any of these conditions: Kidney disease Liver disease An unusual or allergic reaction to rimegepant, other medications, foods, dyes, or preservatives Pregnant or trying to get pregnant Breast-feeding How should I use this medication? Take this medication by mouth. Take it as directed on the prescription label. Leave the tablet in the sealed pack until you are ready to take it. With dry hands, open the pack and gently remove the tablet. If the tablet breaks or crumbles, throw it away. Use a new tablet. Place the tablet in the mouth and allow it to dissolve. Then, swallow it. Do not cut, crush, or chew this medication. You do not need water to take this medication. Talk to your care team about the use of this medication in children. Special care may be needed. Overdosage: If you think you have taken too much of this medicine contact a poison control center or emergency room at once. NOTE: This medicine is only for you. Do not share this medicine with others. What if I miss a dose? This does not apply. This medication is not for regular use. What may interact with this medication? Certain medications for fungal infections, such as fluconazole, itraconazole Rifampin This list may not describe all possible interactions. Give your health care provider a list of all the medicines, herbs, non-prescription drugs, or dietary supplements you use. Also tell them if you smoke, drink alcohol, or use illegal drugs. Some items may interact with your medicine. What should I watch for while using this medication? Visit your care team for regular checks on your progress. Tell your care team if your symptoms do not start to get better or if they get worse. What side effects may I notice from receiving this medication? Side effects that you should report to your care team as  soon as possible: Allergic reactions--skin rash, itching, hives, swelling of the face, lips, tongue, or throat Side effects that usually do not require medical attention (report to your care team if they continue or are bothersome): Nausea Stomach pain This list may not describe all possible side effects. Call your doctor for medical advice about side effects. You may report side effects to FDA at 1-800-FDA-1088. Where should I keep my medication? Keep out of the reach of children and pets. Store at room temperature between 20 and 25 degrees C (68 and 77 degrees F). Get rid of any unused medication after the expiration date. To get rid of medications that are no longer needed or have expired: Take the medication to a medication take-back program. Check with your pharmacy or law enforcement to find a location. If  you cannot return the medication, check the label or package insert to see if the medication should be thrown out in the garbage or flushed down the toilet. If you are not sure, ask your care team. If it is safe to put it in the trash, take the medication out of the container. Mix the medication with cat litter, dirt, coffee grounds, or other unwanted substance. Seal the mixture in a bag or container. Put it in the trash. NOTE: This sheet is a summary. It may not cover all possible information. If you have questions about this medicine, talk to your doctor, pharmacist, or health care provider.  2024 Elsevier/Gold Standard (2022-02-02 00:00:00) Rizatriptan Disintegrating Tablets What is this medication? RIZATRIPTAN (rye za TRIP tan) treats migraines. It works by blocking pain signals and narrowing blood vessels in the brain. It belongs to a group of medications called triptans. It is not used to prevent migraines. This medicine may be used for other purposes; ask your health care provider or pharmacist if you have questions. COMMON BRAND NAME(S): Maxalt-MLT What should I tell my care team  before I take this medication? They need to know if you have any of these conditions: Circulation problems in fingers and toes Diabetes Heart disease High blood pressure High cholesterol History of irregular heartbeat History of stroke Stomach or intestine problems Tobacco use An unusual or allergic reaction to rizatriptan, other medications, foods, dyes, or preservatives Pregnant or trying to get pregnant Breast-feeding How should I use this medication? Take this medication by mouth. Take it as directed on the prescription label. You do not need water to take this medication. Leave the tablet in the sealed pack until you are ready to take it. With dry hands, open the pack and gently remove the tablet. If the tablet breaks or crumbles, throw it away. Use a new tablet. Place the tablet on the tongue and allow it to dissolve. Then, swallow it. Do not cut, crush, or chew this medication. Do not use it more often than directed. Talk to your care team about the use of this medication in children. While it may be prescribed for children as young as 6 years for selected conditions, precautions do apply. Overdosage: If you think you have taken too much of this medicine contact a poison control center or emergency room at once. NOTE: This medicine is only for you. Do not share this medicine with others. What if I miss a dose? This does not apply. This medication is not for regular use. What may interact with this medication? Do not take this medication with any of the following: Ergot alkaloids, such as dihydroergotamine, ergotamine MAOIs, such as Marplan, Nardil, Parnate Other medications for migraine headache, such as almotriptan, eletriptan, frovatriptan, naratriptan, sumatriptan, zolmitriptan This medication may also interact with the following: Certain medications for depression, anxiety, or other mental health conditions Propranolol This list may not describe all possible interactions. Give  your health care provider a list of all the medicines, herbs, non-prescription drugs, or dietary supplements you use. Also tell them if you smoke, drink alcohol, or use illegal drugs. Some items may interact with your medicine. What should I watch for while using this medication? Visit your care team for regular checks on your progress. Tell your care team if your symptoms do not start to get better or if they get worse. This medication may affect your coordination, reaction time, or judgment. Do not drive or operate machinery until you know how this medication affects you.  Sit up or stand slowly to reduce the risk of dizzy or fainting spells. If you take migraine medications for 10 or more days a month, your migraines may get worse. Keep a diary of headache days and medication use. Contact your care team if your migraine attacks occur more frequently. What side effects may I notice from receiving this medication? Side effects that you should report to your care team as soon as possible: Allergic reactions--skin rash, itching, hives, swelling of the face, lips, tongue, or throat Burning, pain, tingling, or color changes in the hands, arms, legs, or feet Heart attack--pain or tightness in the chest, shoulders, arms, or jaw, nausea, shortness of breath, cold or clammy skin, feeling faint or lightheaded Heart rhythm changes--fast or irregular heartbeat, dizziness, feeling faint or lightheaded, chest pain, trouble breathing Increase in blood pressure Irritability, confusion, fast or irregular heartbeat, muscle stiffness, twitching muscles, sweating, high fever, seizure, chills, vomiting, diarrhea, which may be signs of serotonin syndrome Raynaud syndrome--cool, numb, or painful fingers or toes that may change color from pale, to blue, to red Seizures Stroke--sudden numbness or weakness of the face, arm, or leg, trouble speaking, confusion, trouble walking, loss of balance or coordination, dizziness,  severe headache, change in vision Sudden or severe stomach pain, bloody diarrhea, fever, nausea, vomiting Vision loss Side effects that usually do not require medical attention (report to your care team if they continue or are bothersome): Dizziness Unusual weakness or fatigue This list may not describe all possible side effects. Call your doctor for medical advice about side effects. You may report side effects to FDA at 1-800-FDA-1088. Where should I keep my medication? Keep out of the reach of children and pets. Store at room temperature between 15 and 30 degrees C (59 and 86 degrees F). Protect from light and moisture. Get rid of any unused medication after the expiration date. To get rid of medications that are no longer needed or have expired: Take the medication to a medication take-back program. Check with your pharmacy or law enforcement to find a location. If you cannot return the medication, check the label or package insert to see if the medication should be thrown out in the garbage or flushed down the toilet. If you are not sure, ask your care team. If it is safe to put it in the trash, empty the medication out of the container. Mix the medication with cat litter, dirt, coffee grounds, or other unwanted substance. Seal the mixture in a bag or container. Put it in the trash. NOTE: This sheet is a summary. It may not cover all possible information. If you have questions about this medicine, talk to your doctor, pharmacist, or health care provider.  2024 Elsevier/Gold Standard (2022-04-14 00:00:00)

## 2023-08-08 ENCOUNTER — Ambulatory Visit (INDEPENDENT_AMBULATORY_CARE_PROVIDER_SITE_OTHER): Payer: BC Managed Care – PPO

## 2023-08-08 DIAGNOSIS — H4710 Unspecified papilledema: Secondary | ICD-10-CM | POA: Diagnosis not present

## 2023-08-08 DIAGNOSIS — G441 Vascular headache, not elsewhere classified: Secondary | ICD-10-CM

## 2023-08-08 DIAGNOSIS — H539 Unspecified visual disturbance: Secondary | ICD-10-CM

## 2023-08-08 DIAGNOSIS — R51 Headache with orthostatic component, not elsewhere classified: Secondary | ICD-10-CM | POA: Diagnosis not present

## 2023-08-08 DIAGNOSIS — R4189 Other symptoms and signs involving cognitive functions and awareness: Secondary | ICD-10-CM

## 2023-08-08 DIAGNOSIS — R2689 Other abnormalities of gait and mobility: Secondary | ICD-10-CM

## 2023-08-08 DIAGNOSIS — R519 Headache, unspecified: Secondary | ICD-10-CM

## 2023-08-08 DIAGNOSIS — R4701 Aphasia: Secondary | ICD-10-CM

## 2023-08-08 DIAGNOSIS — H5713 Ocular pain, bilateral: Secondary | ICD-10-CM | POA: Diagnosis not present

## 2023-08-08 DIAGNOSIS — R400 Somnolence: Secondary | ICD-10-CM

## 2023-08-08 DIAGNOSIS — R5382 Chronic fatigue, unspecified: Secondary | ICD-10-CM

## 2023-08-08 DIAGNOSIS — R278 Other lack of coordination: Secondary | ICD-10-CM

## 2023-08-08 MED ORDER — GADOBENATE DIMEGLUMINE 529 MG/ML IV SOLN
20.0000 mL | Freq: Once | INTRAVENOUS | Status: AC | PRN
  Administered 2023-08-08: 20 mL via INTRAVENOUS

## 2023-08-23 ENCOUNTER — Telehealth: Payer: Self-pay | Admitting: Neurology

## 2023-08-23 NOTE — Telephone Encounter (Signed)
Pt has called to report that she is out of the Nurtec samples and now out out of the rizatriptan as well.  Pt would like to know if a script can be called in for the Nurtec, and can she get a refill on the rizatriptan to [facility] [phone]

## 2023-09-11 ENCOUNTER — Institutional Professional Consult (permissible substitution): Payer: BC Managed Care – PPO | Admitting: Neurology

## 2023-12-06 ENCOUNTER — Encounter: Payer: Self-pay | Admitting: Neurology

## 2023-12-06 ENCOUNTER — Telehealth: Payer: Self-pay | Admitting: Neurology

## 2023-12-06 ENCOUNTER — Ambulatory Visit: Payer: BC Managed Care – PPO | Admitting: Neurology

## 2023-12-06 VITALS — BP 113/71 | HR 84 | Ht 66.5 in | Wt 205.6 lb

## 2023-12-06 DIAGNOSIS — G43711 Chronic migraine without aura, intractable, with status migrainosus: Secondary | ICD-10-CM

## 2023-12-06 MED ORDER — GALCANEZUMAB-GNLM 120 MG/ML ~~LOC~~ SOAJ
240.0000 mg | Freq: Once | SUBCUTANEOUS | Status: DC
Start: 2023-12-06 — End: 2024-08-20

## 2023-12-06 MED ORDER — NURTEC 75 MG PO TBDP
75.0000 mg | ORAL_TABLET | ORAL | 0 refills | Status: DC
Start: 2023-12-06 — End: 2024-11-04

## 2023-12-06 MED ORDER — METHYLPREDNISOLONE ACETATE 80 MG/ML IJ SUSP
80.0000 mg | Freq: Once | INTRAMUSCULAR | Status: AC
Start: 2023-12-06 — End: 2023-12-06
  Administered 2023-12-06: 80 mg via INTRAMUSCULAR

## 2023-12-06 MED ORDER — KETOROLAC TROMETHAMINE 60 MG/2ML IM SOLN
60.0000 mg | Freq: Once | INTRAMUSCULAR | Status: AC
Start: 2023-12-06 — End: 2023-12-06
  Administered 2023-12-06: 60 mg via INTRAMUSCULAR

## 2023-12-06 NOTE — Telephone Encounter (Signed)
Pt will be a new start Botox, she will send me her insurance card for 2025 once she gets it. Can see NP or Dr. Lucia Gaskins once approved.  200 units G43.711

## 2023-12-06 NOTE — Progress Notes (Unsigned)
[facility]    Provider:  Dr Lucia Gaskins Requesting Provider: Blair Heys, MD Primary Care Provider:  Blair Heys, MD (Inactive)  CC:  Chronic migraines   08/08/2023: Still having 23-30 total headace days a month and >10 moderate to severe migraine days a month lasting 4-24 hours for > 6 months tried and failed multiple medications and all the cgrp injections, no aura, no medication oversue, pulsating pounding throbbing, can be moderate to severe, affecting the quality of her life and affecting her work, it can be unilateral and spread bilaterally, nausea, photophobia, phonophobia, osmophobia, hurts to move, can last upwards of 24 hours a day, ongoing since 2023 and debilitating, most days she has a constant low-grade headache, on top of that moderate to severe migraines usually behind the eyes, smells can make it worse, she also has vomiting, it hurts to move, she is in a dark room (today she is in a dark room in my office).   MRi brain: IMPRESSION: reviewed images and labwork  Normal MRI brain (with and without).  MRI cervical spine: 04/03/2023: IMPRESSION: 1. Interval C5-C6 ACDF without residual stenosis. 2. Unchanged mild multilevel cervical spondylosis as described above without high-grade stenosis or impingement.   Patient complains of symptoms per HPI as well as the following symptoms: none . Pertinent negatives and positives per HPI. All others negative       Latest Ref Rng & Units 08/01/2023    2:32 PM 08/17/2010    5:49 AM 08/13/2010    2:49 PM  CBC  WBC 3.4 - 10.8 x10E3/uL 7.5  8.3  9.1   Hemoglobin 11.1 - 15.9 g/dL 98.1  19.1  47.8   Hematocrit 34.0 - 46.6 % 42.9  28.9  39.5   Platelets 150 - 450 x10E3/uL 241  236  263       Latest Ref Rng & Units 08/01/2023    2:32 PM 03/31/2008    7:02 AM  CMP  Glucose 70 - 99 mg/dL 87  95   BUN 6 - 24 mg/dL 5  12   Creatinine 2.95 - 1.00 mg/dL 6.21  3.08   Sodium 657 - 144 mmol/L 139  140   Potassium 3.5 - 5.2  mmol/L 4.3  3.9   Chloride 96 - 106 mmol/L 100  105   CO2 20 - 29 mmol/L 25  27   Calcium 8.7 - 10.2 mg/dL 9.3  9.6   Total Protein 6.0 - 8.5 g/dL 6.9    Total Bilirubin 0.0 - 1.2 mg/dL <8.4    Alkaline Phos 44 - 121 IU/L 69    AST 0 - 40 IU/L 14    ALT 0 - 32 IU/L 13       HPI:  Hayley Hamilton is a 47 y.o. female here as requested by Blair Heys, MD for migraines. has ABDOMINAL PAIN, LEFT LOWER QUADRANT; DOE (dyspnea on exertion); Orthostatic hypotension; Right ankle injury; and Chronic migraine without aura, with intractable migraine, so stated, with status migrainosus on their problem list.  2 years ago she had neck pain, went to spine and had acdf. Started having headaches then starte to be migraines in 2023. Debilitating. She has a constant low grade pain. On top of that become moderate to severe, pulsating/pounding/throbbing/constant, usually behind the eyes, photo/phonophobia, nausea, smells can make worse or trigger, vomiting, hurts to move, leayes in a dark room. Motion sensitivity. Had acdf but still myofascial muscle cervical pain remains, she went back to work Nov 2023, daily  headaches and daily myofascial neck pain. Tried dry needling, went to PT, ice, heating, failed conservative measures. Daily headaches. She misses work often. 16 moderate to severe migraine days a month, last up to 72 hours, sleep helps but wakes up with it, she has blurry vision and vision changes but 3 vision to optho and nothing found, she has impaired coordination, imbalance, difficulty communication, aphasia getting words out, cognitive deficits, fatigue, morning headaches. Worse in the morning when waking. No snoring. Wakes with dry mouth. Wakes often. She never wake up refreshed. Never had sleep apnea testing, mother has sleep apnea, she has gained 50+ pounds.Noaura, no medication overuse, ongoing at this freq and severity for > 1 year, affecting life and work. Also neck pain. No other focal neurologic  deficits, associated symptoms, inciting events or modifiable factors.   Reviewed notes, labs and imaging from outside physicians, which showed:   From a thorough review of records, medications tried greater than 3 months include: Tried topiramate (side effects could not tolerate), BP medications contraindicated due to hypotension tried propranolol and had hypotension, gabapentin, lyrica, robaxin, naproxen lexapro,tried amitriptyline and nortriptyline in the past and now  cannot take amirtiptyline or nortriptyline because she is currently on another seratonergic agent lexapro and combination can cause seratonin syndrome, sumatriptan, rizatriptan, zonisamide, Aimovig contraindicated due to constipation, Emgality  MRI cervical spine 03/2023: FINDINGS: Alignment: Chronic straightening of the normal cervical lordosis. No significant listhesis.   Vertebrae: No fracture, evidence of discitis, or bone lesion. Prior C5-C6 ACDF.   Cord: Normal signal and morphology.   Posterior Fossa, vertebral arteries, paraspinal tissues: Negative.   Disc levels:   C2-C3: Negative disc. Mild bilateral facet arthropathy. No stenosis.   C3-C4: Unchanged minimal disc bulging with moderate right and mild left facet arthropathy. No stenosis.   C4-C5: Unchanged mild disc bulging and bilateral facet arthropathy. No stenosis.   C5-C6: Interval ACDF. Moderate right facet arthropathy. No residual stenosis.   C6-C7: Unchanged mild disc bulging and moderate bilateral facet arthropathy. Unchanged borderline mild left neuroforaminal stenosis. No spinal canal or right neuroforaminal stenosis.   C7-T1: Negative disc. Unchanged mild to moderate left facet arthropathy. No stenosis.   IMPRESSION: 1. Interval C5-C6 ACDF without residual stenosis. 2. Unchanged mild multilevel cervical spondylosis as described above without high-grade stenosis or impingement.  Review of Systems: Patient complains of symptoms per HPI as  well as the following symptoms migraine. Pertinent negatives and positives per HPI. All others negative.   Social History   Socioeconomic History   Marital status: Married    Spouse name: Not on file   Number of children: Not on file   Years of education: Not on file   Highest education level: Not on file  Occupational History   Not on file  Tobacco Use   Smoking status: Former   Smokeless tobacco: Never   Tobacco comments:    quit 2006-2007  Vaping Use   Vaping status: Never Used  Substance and Sexual Activity   Alcohol use: Yes    Alcohol/week: 1.0 - 2.0 standard drink of alcohol    Types: 1 - 2 Standard drinks or equivalent per week    Comment: hasn't had any in 2 months, but otherwise 1-2 drinks per week   Drug use: No   Sexual activity: Not on file  Other Topics Concern   Not on file  Social History Narrative   Caffeine: 2 cups coffee in the morning   Right handed   Social Drivers of Health  Financial Resource Strain: Not on file  Food Insecurity: Not on file  Transportation Needs: Not on file  Physical Activity: Not on file  Stress: Not on file  Social Connections: Not on file  Intimate Partner Violence: Not on file    Family History  Problem Relation Age of Onset   Colon polyps Father    Diabetes Maternal Grandmother    Diabetes Maternal Grandfather    Diabetes Paternal Grandmother    Irritable bowel syndrome Paternal Grandmother    Colon cancer Paternal Grandfather    Colon polyps Paternal Grandfather    Diabetes Paternal Grandfather    Heart disease Paternal Grandfather    Colon cancer Paternal Uncle    Colon polyps Paternal Uncle    Migraines Neg Hx     Past Medical History:  Diagnosis Date   Breast mass 05/02/2017   bilat breast masses and lt axilla    Patient Active Problem List   Diagnosis Date Noted   Chronic migraine without aura, with intractable migraine, so stated, with status migrainosus 12/07/2023   Right ankle injury  04/29/2015   DOE (dyspnea on exertion) 12/03/2013   Orthostatic hypotension 12/03/2013   ABDOMINAL PAIN, LEFT LOWER QUADRANT 07/03/2008    Past Surgical History:  Procedure Laterality Date   ABDOMINAL HYSTERECTOMY     ACDF  09/2022   APPENDECTOMY  2003   BREAST EXCISIONAL BIOPSY Right    COLONOSCOPY      Current Outpatient Medications  Medication Sig Dispense Refill   escitalopram (LEXAPRO) 20 MG tablet Take 20 mg by mouth daily.     Fremanezumab-vfrm (AJOVY) 225 MG/1.5ML SOAJ Inject 225 mg into the skin every 30 (thirty) days. Please run copay card: BIN# [id number] PCN# [id number] GRP# [id number] ID# [id number] 1.5 mL 11   LORazepam (ATIVAN) 0.5 MG tablet Take 0.5 mg by mouth every 8 (eight) hours as needed for anxiety.     methocarbamol (ROBAXIN) 500 MG tablet Take 500 mg by mouth 3 (three) times daily as needed.     ondansetron (ZOFRAN-ODT) 4 MG disintegrating tablet Take 1-2 tablets (4-8 mg total) by mouth every 8 (eight) hours as needed. 30 tablet 3   Rimegepant Sulfate (NURTEC) 75 MG TBDP Take 1 tablet (75 mg total) by mouth every other day. For migraines. Take as close to onset of migraine as possible. One daily maximum. 16 tablet 0   rizatriptan (MAXALT-MLT) 10 MG disintegrating tablet Take 1 tablet (10 mg total) by mouth as needed for migraine. May repeat in 2 hours if needed 9 tablet 11   zolpidem (AMBIEN) 10 MG tablet   5   methylPREDNISolone (MEDROL DOSEPAK) 4 MG TBPK tablet Take pills daily all together with food. Take the first dose (6 pills) as soon as possible. Take the rest each morning. For 6 days total 6-5-4-3-2-1. (Patient not taking: Reported on 12/06/2023) 21 tablet 1   Rimegepant Sulfate (NURTEC) 75 MG TBDP Take one daily for 8 days or every other day if feeling well (Patient not taking: Reported on 12/06/2023)     Current Facility-Administered Medications  Medication Dose Route Frequency Provider Last Rate Last Admin   Galcanezumab-gnlm SOAJ 240 mg  240 mg Subcutaneous Once          Allergies as of 12/06/2023 - Review Complete 12/06/2023  Allergen Reaction Noted   Codeine      Vitals: BP 113/71   Pulse 84   Ht 5' 6.5" (1.689 m)   Wt 205 lb 9.6 oz (93.3 kg)  LMP 08/20/2012   BMI 32.69 kg/m  Last Weight:  Wt Readings from Last 1 Encounters:  12/06/23 205 lb 9.6 oz (93.3 kg)   Last Height:   Ht Readings from Last 1 Encounters:  12/06/23 5' 6.5" (1.689 m)   Physical exam: Exam: Gen: NAD, conversant, well nourised, obese, well groomed                     CV: RRR, no MRG. No Carotid Bruits. No peripheral edema, warm, nontender Eyes: Conjunctivae clear without exudates or hemorrhage  Neuro: Detailed Neurologic Exam  Speech:    Speech is normal; fluent and spontaneous with normal comprehension.  Cognition:    The patient is oriented to person, place, and time;     recent and remote memory intact;     language fluent;     normal attention, concentration,     fund of knowledge Cranial Nerves:    The pupils are equal, round, and reactive to light. The fundi are normal and spontaneous venous pulsations are present. Visual fields are full to finger confrontation. Extraocular movements are intact. Trigeminal sensation is intact and the muscles of mastication are normal. The face is symmetric. The palate elevates in the midline. Hearing intact. Voice is normal. Shoulder shrug is normal. The tongue has normal motion without fasciculations.   Coordination:    Normal finger to nose and heel to shin. Normal rapid alternating movements.   Gait:    Heel-toe and tandem gait are normal.   Motor Observation:    No asymmetry, no atrophy, and no involuntary movements noted. Tone:    Normal muscle tone.    Posture:    Posture is normal. normal erect    Strength:    Strength is V/V in the upper and lower limbs.      Sensation: intact to LT     Reflex Exam:  DTR's:    Deep tendon reflexes in the upper and lower extremities are normal bilaterally.    Toes:    The toes are downgoing bilaterally.   Clonus:    Clonus is absent.    Assessment/Plan:  Patient with chronic migraines. Discussed options today. MRI brain normal   Still having 23- 30 total headace days a month and 10 moderate to severe migraine days a month lasting 4-24 hours for at least > 6 months tried and failed multiple medications and all the cgrp injections, no aura, no medication oversue, pulsating pounding throbbing, can be moderate to severe, affecting the quality of her life and affecting her work, it can be unilateral and spread bilaterally, nausea, photophobia, phonophobia, osmophobia, hurts to move, can last upwards of 24 hours a day, ongoing since 2023 and debilitating, most days she has a constant low-grade headache, on top of that moderate to severe migraines usually behind the eyes, smells can make it worse, she also has vomiting, it hurts to move, he is in a dark room (today she is in a dark room in my office).  From a thorough review of records, medications tried greater than 3 months include: Tried topiramate (side effects could not tolerate), BP medications contraindicated due to hypotension tried propranolol and had hypotension, gabapentin, lyrica, robaxin, naproxen lexapro,tried amitriptyline and nortriptyline in the past and now  cannot take amirtiptyline or nortriptyline because she is currently on another seratonergic agent lexapro and combination can cause seratonin syndrome, sumatriptan, rizatriptan, zonisamide, Aimovig contraindicated due to constipation, Emgality  Start Botox protocol early next month when she  has new insurance. Needs to take a picture of new insuramce card and mychart to [place] and we will start the botox protocol as soon as posisble early January when new insurance kicks in. Today will inject with emgality, give nurtec every other day to "briudge" until botox.   Other options include Qulipta daily pill, Vyepti every 3 month infusion, nurtec  every other day Samples nurtec every other day for one month to help bridge her until botox Botox for migraines - every 3 months  - let us know asap your new card information and beginning of Jan will get you approved.  Today gave I toradol and IM prednisone ONO overnight Ox monitor  Cc: Blair Heys, MD,  Blair Heys, MD (Inactive)  Naomie Dean, MD  [facility] [address] [place], [place] [address]  Phone [phone] Fax [phone]  I spent over 40 minutes of face-to-face and non-face-to-face time with patient on the  1. Chronic migraine without aura, with intractable migraine, so stated, with status migrainosus    diagnosis.  This included previsit chart review, lab review, study review, order entry, electronic health record documentation, patient education on the different diagnostic and therapeutic options, counseling and coordination of care, risks and benefits of management, compliance, or risk factor reduction

## 2023-12-06 NOTE — Progress Notes (Unsigned)
Verbal order:  Toradol 60mg  / 2ml, and Medrol 80mg  / 1 ml Per dr. Lucia Gaskins.  Under aseptic technique Toradol 60mg  IM to R upper outer gluteal quadrant, bandaid applied. Medrol 80mg  IM R Deltoid. Tolerated well.  Bandaid applied.

## 2023-12-06 NOTE — Patient Instructions (Addendum)
emgality(injection today)  )Other options include Qulipta daily pill, Vyepti every 3 month infusion, nurtec every other day) Samples nurtec every other day for one month Botox for migraines - every 3 months  - let us know asap your new card information and beginning of Jan will get you approved.  Today gave I toradol and IM prednisone ONO overnight Ox monitor  OnabotulinumtoxinA Injection (Medical Use) What is this medication? ONABOTULINUMTOXINA (o na BOTT you lye num tox in eh) treats severe muscle spasms. It may also be used to prevent migraine headaches. It can treat excessive sweating when other medications do not work well enough. This medicine may be used for other purposes; ask your health care provider or pharmacist if you have questions. COMMON BRAND NAME(S): Botox What should I tell my care team before I take this medication? They need to know if you have any of these conditions: Breathing problems Cerebral palsy spasms Difficulty urinating Heart problems History of surgery where this medication is going to be used Infection at the site where this medication is going to be used Myasthenia gravis or other neurologic disease Nerve or muscle disease Surgery plans Take medications that treat or prevent blood clots Thyroid problems An unusual or allergic reaction to botulinum toxin, albumin, other medications, foods, dyes, or preservatives Pregnant or trying to get pregnant Breast-feeding How should I use this medication? This medication is for injected into a muscle. It is given by your care team in a hospital or clinic setting. A special MedGuide will be given to you before each treatment. Be sure to read this information carefully each time. Talk to your care team about the use of this medication in children. While this medication may be prescribed for children as young as 2 years for selected conditions, precautions do apply. Overdosage: If you think you have taken too much  of this medicine contact a poison control center or emergency room at once. NOTE: This medicine is only for you. Do not share this medicine with others. What if I miss a dose? This does not apply. What may interact with this medication? Aminoglycoside antibiotics, such as gentamicin, neomycin, tobramycin Muscle relaxants Other botulinum toxin injections This list may not describe all possible interactions. Give your health care provider a list of all the medicines, herbs, non-prescription drugs, or dietary supplements you use. Also tell them if you smoke, drink alcohol, or use illegal drugs. Some items may interact with your medicine. What should I watch for while using this medication? Visit your care team for regular check ups. This medication will cause weakness in the muscle where it is injected. Tell your care team if you feel unusually weak in other muscles. Get medical help right away if you have problems with breathing, swallowing, or talking. This medication might make your eyelids droop or make you see blurry or double. If you have weak muscles or trouble seeing do not drive a car, use machinery, or do other dangerous activities. This medication contains albumin from human blood. It may be possible to pass an infection in this medication, but no cases have been reported. Talk to your care team about the risks and benefits of this medication. If your activities have been limited by your condition, go back to your regular routine slowly after treatment with this medication. What side effects may I notice from receiving this medication? Side effects that you should report to your care team as soon as possible: Allergic reactions--skin rash, itching, hives, swelling of the  face, lips, tongue, or throat Dryness or irritation of the eyes, eye pain, change in vision, sensitivity to light Infection--fever, chills, cough, sore throat, wounds that don't heal, pain or trouble when passing urine,  general feeling of discomfort or being unwell Spread of botulinum toxin effects--unusual weakness or fatigue, blurry or double vision, trouble swallowing, hoarseness or trouble speaking, trouble breathing, loss of bladder control Trouble passing urine Side effects that usually do not require medical attention (report these to your care team if they continue or are bothersome): Dry mouth Eyelid drooping Fatigue Headache Pain, redness, or irritation at injection site This list may not describe all possible side effects. Call your doctor for medical advice about side effects. You may report side effects to FDA at 1-800-FDA-1088. Where should I keep my medication? This medication is given in a hospital or clinic and will not be stored at home. NOTE: This sheet is a summary. It may not cover all possible information. If you have questions about this medicine, talk to your doctor, pharmacist, or health care provider.  2024 Elsevier/Gold Standard (2021-12-09 00:00:00)  Galcanezumab Injection What is this medication? GALCANEZUMAB (gal ka NEZ ue mab) prevents migraines. It works by blocking a substance in the body that causes migraines. It may also be used to treat cluster headaches. It is a monoclonal antibody. This medicine may be used for other purposes; ask your health care provider or pharmacist if you have questions. COMMON BRAND NAME(S): Emgality What should I tell my care team before I take this medication? They need to know if you have any of these conditions: An unusual or allergic reaction to galcanezumab, other medications, foods, dyes, or preservatives Pregnant or trying to get pregnant Breast-feeding How should I use this medication? This medication is injected under the skin. You will be taught how to prepare and give it. Take it as directed on the prescription label. Keep taking it unless your care team tells you to stop. It is important that you put your used needles and syringes  in a special sharps container. Do not put them in a trash can. If you do not have a sharps container, call your pharmacist or care team to get one. Talk to your care team about the use of this medication in children. Special care may be needed. Overdosage: If you think you have taken too much of this medicine contact a poison control center or emergency room at once. NOTE: This medicine is only for you. Do not share this medicine with others. What if I miss a dose? If you miss a dose, take it as soon as you can. If it is almost time for your next dose, take only that dose. Do not take double or extra doses. What may interact with this medication? Interactions are not expected. This list may not describe all possible interactions. Give your health care provider a list of all the medicines, herbs, non-prescription drugs, or dietary supplements you use. Also tell them if you smoke, drink alcohol, or use illegal drugs. Some items may interact with your medicine. What should I watch for while using this medication? Visit your care team for regular checks on your progress. Tell your care team if your symptoms do not start to get better or if they get worse. What side effects may I notice from receiving this medication? Side effects that you should report to your care team as soon as possible: Allergic reactions or angioedema--skin rash, itching or hives, swelling of the face, eyes,  lips, tongue, arms, or legs, trouble swallowing or breathing Side effects that usually do not require medical attention (report to your care team if they continue or are bothersome): Pain, redness, or irritation at injection site This list may not describe all possible side effects. Call your doctor for medical advice about side effects. You may report side effects to FDA at 1-800-FDA-1088. Where should I keep my medication? Keep out of the reach of children and pets. Store in a refrigerator or at room temperature between 20  and 25 degrees C (68 and 77 degrees F). Refrigeration (preferred): Store in the refrigerator. Do not freeze. Keep in the original container until you are ready to take it. Remove the dose from the carton about 30 minutes before it is time for you to use it. If the dose is not used, it may be stored in original container at room temperature for 7 days. Get rid of any unused medication after the expiration date. Room Temperature: This medication may be stored at room temperature for up to 7 days. Keep it in the original container. Protect from light until time of use. If it is stored at room temperature, get rid of any unused medication after 7 days or after it expires, whichever is first. To get rid of medications that are no longer needed or have expired: Take the medication to a medication take-back program. Check with your pharmacy or law enforcement to find a location. If you cannot return the medication, ask your pharmacist or care team how to get rid of this medication safely. NOTE: This sheet is a summary. It may not cover all possible information. If you have questions about this medicine, talk to your doctor, pharmacist, or health care provider.  2024 Elsevier/Gold Standard (2022-02-07 00:00:00)

## 2023-12-07 ENCOUNTER — Encounter: Payer: Self-pay | Admitting: Neurology

## 2023-12-07 ENCOUNTER — Telehealth: Payer: Self-pay | Admitting: Neurology

## 2023-12-07 DIAGNOSIS — R519 Headache, unspecified: Secondary | ICD-10-CM

## 2023-12-07 DIAGNOSIS — R0902 Hypoxemia: Secondary | ICD-10-CM

## 2023-12-07 DIAGNOSIS — G43711 Chronic migraine without aura, intractable, with status migrainosus: Secondary | ICD-10-CM | POA: Insufficient documentation

## 2023-12-07 NOTE — Telephone Encounter (Signed)
Can you please order an ONO for her overnight? Thank you

## 2023-12-07 NOTE — Telephone Encounter (Signed)
She is going on new insuramce next Month. She will email [place] her insurance and would like to start the botox protocol. G43.711. When she gets approved I can squeeze her into my schedule in January and then transition her to an NP, let me know when she gets approved and I will find a spot for her thanks!

## 2023-12-08 NOTE — Telephone Encounter (Signed)
ONO x 1 ordered. Order sent to [facility]. Patient has been notified to be on the lookout for a call from them.

## 2023-12-08 NOTE — Telephone Encounter (Signed)
 Zott, Linnell Fulling, Otilio Jefferson, RN; [place], [place] Got It Thank  You

## 2023-12-08 NOTE — Addendum Note (Signed)
Addended by: Bertram Savin on: 12/08/2023 09:25 AM   Modules accepted: Orders

## 2023-12-14 NOTE — Telephone Encounter (Signed)
Submitted benefit verification for 2025, will update once results are available. [id number]

## 2023-12-28 ENCOUNTER — Encounter (HOSPITAL_COMMUNITY): Payer: Self-pay | Admitting: Emergency Medicine

## 2023-12-28 ENCOUNTER — Other Ambulatory Visit: Payer: Self-pay

## 2023-12-28 ENCOUNTER — Emergency Department (HOSPITAL_COMMUNITY)
Admission: EM | Admit: 2023-12-28 | Discharge: 2023-12-28 | Discharge status: Against Medical Advice | Payer: 59 | Attending: Emergency Medicine | Admitting: Emergency Medicine

## 2023-12-28 DIAGNOSIS — R197 Diarrhea, unspecified: Secondary | ICD-10-CM | POA: Insufficient documentation

## 2023-12-28 DIAGNOSIS — R1030 Lower abdominal pain, unspecified: Secondary | ICD-10-CM | POA: Diagnosis not present

## 2023-12-28 DIAGNOSIS — Z5321 Procedure and treatment not carried out due to patient leaving prior to being seen by health care provider: Secondary | ICD-10-CM | POA: Diagnosis not present

## 2023-12-28 LAB — COMPREHENSIVE METABOLIC PANEL
ALT: 19 U/L (ref 0–44)
AST: 21 U/L (ref 15–41)
Albumin: 3.9 g/dL (ref 3.5–5.0)
Alkaline Phosphatase: 58 U/L (ref 38–126)
Anion gap: 11 (ref 5–15)
BUN: <5 mg/dL — ABNORMAL LOW (ref 6–20)
CO2: 25 mmol/L (ref 22–32)
Calcium: 9.3 mg/dL (ref 8.9–10.3)
Chloride: 95 mmol/L — ABNORMAL LOW (ref 98–111)
Creatinine, Ser: 0.75 mg/dL (ref 0.44–1.00)
GFR, Estimated: >60 mL/min (ref >60)
Glucose, Bld: 89 mg/dL (ref 70–99)
Potassium: 3.8 mmol/L (ref 3.5–5.1)
Sodium: 131 mmol/L — ABNORMAL LOW (ref 135–145)
Total Bilirubin: 0.9 mg/dL (ref 0.0–1.2)
Total Protein: 6.5 g/dL (ref 6.5–8.1)

## 2023-12-28 LAB — URINALYSIS, ROUTINE W REFLEX MICROSCOPIC
Bilirubin Urine: NEGATIVE
Glucose, UA: NEGATIVE mg/dL
Hgb urine dipstick: NEGATIVE
Ketones, ur: 5 mg/dL — AB
Leukocytes,Ua: NEGATIVE
Nitrite: NEGATIVE
Protein, ur: NEGATIVE mg/dL
Specific Gravity, Urine: 1.001 — ABNORMAL LOW (ref 1.005–1.030)
pH: 6 (ref 5.0–8.0)

## 2023-12-28 LAB — CBC
HCT: 42.8 % (ref 36.0–46.0)
Hemoglobin: 14.2 g/dL (ref 12.0–15.0)
MCH: 30.7 pg (ref 26.0–34.0)
MCHC: 33.2 g/dL (ref 30.0–36.0)
MCV: 92.6 fL (ref 80.0–100.0)
Platelets: 229 10*3/uL (ref 150–400)
RBC: 4.62 MIL/uL (ref 3.87–5.11)
RDW: 12.9 % (ref 11.5–15.5)
WBC: 10.5 10*3/uL (ref 4.0–10.5)
nRBC: 0 % (ref 0.0–0.2)

## 2023-12-28 LAB — LIPASE, BLOOD: Lipase: 23 U/L (ref 11–51)

## 2023-12-28 NOTE — ED Triage Notes (Signed)
 Pt via POV c/o diarrhea since Monday with blood in stool. Estimates 6 episodes diarrhea today. Lower abd pain rated 7/10 and pt appears uncomfortable in triage.

## 2024-01-09 ENCOUNTER — Other Ambulatory Visit (HOSPITAL_COMMUNITY): Payer: Self-pay | Admitting: Pharmacy Technician

## 2024-01-09 ENCOUNTER — Other Ambulatory Visit: Payer: Self-pay

## 2024-01-09 ENCOUNTER — Other Ambulatory Visit (HOSPITAL_COMMUNITY): Payer: Self-pay

## 2024-01-09 MED ORDER — ONABOTULINUMTOXINA 200 UNITS IJ SOLR
INTRAMUSCULAR | 3 refills | Status: AC
  Filled 2024-01-09: qty 1, fill #0
  Filled 2024-04-11 (×2): qty 1, 84d supply, fill #0
  Filled 2024-07-12: qty 1, 84d supply, fill #1
  Filled 2024-10-09: qty 1, 84d supply, fill #2
  Filled 2024-12-25 – 2025-01-06 (×2): qty 1, 84d supply, fill #3

## 2024-01-09 NOTE — Telephone Encounter (Addendum)
 Received approval, please send rx to [facility].  Dr. Ines- letting you know pt was approved, you had mentioned working her in with you in January. Is there a certain day you'd want her added on? It would need to be towards the end of the month so we have time to get in from pharmacy.  Auth#: [id number] (01/09/24-07/08/24)

## 2024-01-09 NOTE — Addendum Note (Signed)
 Addended by: Bertram Savin on: 01/09/2024 12:04 PM   Modules accepted: Orders

## 2024-01-09 NOTE — Telephone Encounter (Signed)
 Submitted auth request via CMM, status is pending. Key: [id number]

## 2024-01-11 ENCOUNTER — Other Ambulatory Visit (HOSPITAL_COMMUNITY): Payer: Self-pay

## 2024-01-12 ENCOUNTER — Other Ambulatory Visit (HOSPITAL_COMMUNITY): Payer: Self-pay

## 2024-01-16 ENCOUNTER — Other Ambulatory Visit (HOSPITAL_COMMUNITY): Payer: Self-pay

## 2024-01-16 ENCOUNTER — Other Ambulatory Visit: Payer: Self-pay

## 2024-01-16 NOTE — Telephone Encounter (Signed)
Called pt again and was able to get her scheduled for 2/3 @ 3:00 pm. Advised her that [facility] would be calling for consent. She did not have any questions at this time.

## 2024-01-22 ENCOUNTER — Encounter: Payer: Self-pay | Admitting: *Deleted

## 2024-01-22 NOTE — Telephone Encounter (Signed)
Please let patient know the ONO does show her oxygen levels drop at night and she should have a sleep apnea test. If she is willing I can refer her for that (has she ever had a sleep test or seen a pulmonologist for sleep apnea? If so we can refer her back there if not I can refer to Dr. Vickey Huger and give her the ONO thanks) thanks

## 2024-01-22 NOTE — Telephone Encounter (Signed)
Opened in error

## 2024-01-22 NOTE — Telephone Encounter (Signed)
Received results of ONO for review.

## 2024-01-23 NOTE — Telephone Encounter (Signed)
LMVM for pt to return call about her ONO results.

## 2024-01-24 ENCOUNTER — Other Ambulatory Visit: Payer: Self-pay

## 2024-01-24 NOTE — Addendum Note (Signed)
Addended by: Guy Begin on: 01/24/2024 08:48 AM   Modules accepted: Orders

## 2024-01-24 NOTE — Telephone Encounter (Signed)
Pt called back and I relayed that her ONO did show drop in oxygen levels. Recommended sleep consult.  She had not seen any sleep provider or pulmonologistin the past.  I relayed will refer to sleep provider here , they will call her to set up appt.  She appreciated call back.

## 2024-01-25 ENCOUNTER — Other Ambulatory Visit (HOSPITAL_COMMUNITY): Payer: Self-pay

## 2024-01-25 NOTE — Telephone Encounter (Signed)
Call placed to patient to set up onboarding and to receive consent for delivery of medication to the office. Left message for patient to call me back at [phone].

## 2024-01-26 ENCOUNTER — Other Ambulatory Visit (HOSPITAL_COMMUNITY): Payer: Self-pay

## 2024-01-26 NOTE — Progress Notes (Signed)
Specialty Pharmacy Initial Fill Coordination Note  Hayley Hamilton is a 48 y.o. female contacted today regarding initial fill of specialty medication(s) OnabotulinumtoxinA (BOTOX)   Patient requested Courier to Provider Office   Delivery date: 01/30/2024  Verified address: [address], [place], [place] [address]   Medication will be filled on 01/29/2024.   Patient is aware of 0 copayment.

## 2024-01-26 NOTE — Telephone Encounter (Signed)
LVM 2x asking pt to call [facility] for consent to ship.

## 2024-01-29 ENCOUNTER — Ambulatory Visit: Payer: 59 | Admitting: Neurology

## 2024-01-29 DIAGNOSIS — G43711 Chronic migraine without aura, intractable, with status migrainosus: Secondary | ICD-10-CM | POA: Diagnosis not present

## 2024-01-29 MED ORDER — ONABOTULINUMTOXINA 200 UNITS IJ SOLR
155.0000 [IU] | Freq: Once | INTRAMUSCULAR | Status: AC
  Administered 2024-01-29: 155 [IU] via INTRAMUSCULAR

## 2024-01-29 NOTE — Progress Notes (Signed)
Botox consent signed  Botox- 200 units x 1 vial Lot: [id number] Expiration: 03/2026 NDC: 1610-9604-54  Bacteriostatic 0.9% Sodium Chloride- 4 mL  Lot: [id number] Expiration: 10/26/2024 NDC: 1478-2956-21  Dx: H08.657 S/P Witnessed by Truitt Leep RN

## 2024-01-29 NOTE — Progress Notes (Signed)
Consent Form Botulism Toxin Injection For Chronic Migraine  01/29/2024: first botox, put the botox a little high on the forehead to avoid dropping has a heavy brow.   Reviewed orally with patient, additionally signature is on file:  Botulism toxin has been approved by the Federal drug administration for treatment of chronic migraine. Botulism toxin does not cure chronic migraine and it may not be effective in some patients.  The administration of botulism toxin is accomplished by injecting a small amount of toxin into the muscles of the neck and head. Dosage must be titrated for each individual. Any benefits resulting from botulism toxin tend to wear off after 3 months with a repeat injection required if benefit is to be maintained. Injections are usually done every 3-4 months with maximum effect peak achieved by about 2 or 3 weeks. Botulism toxin is expensive and you should be sure of what costs you will incur resulting from the injection.  The side effects of botulism toxin use for chronic migraine may include:   -Transient, and usually mild, facial weakness with facial injections  -Transient, and usually mild, head or neck weakness with head/neck injections  -Reduction or loss of forehead facial animation due to forehead muscle weakness  -Eyelid drooping  -Dry eye  -Pain at the site of injection or bruising at the site of injection  -Double vision  -Potential unknown long term risks  Contraindications: You should not have Botox if you are pregnant, nursing, allergic to albumin, have an infection, skin condition, or muscle weakness at the site of the injection, or have myasthenia gravis, Lambert-Eaton syndrome, or ALS.  It is also possible that as with any injection, there may be an allergic reaction or no effect from the medication. Reduced effectiveness after repeated injections is sometimes seen and rarely infection at the injection site may occur. All care will be taken to prevent these  side effects. If therapy is given over a long time, atrophy and wasting in the muscle injected may occur. Occasionally the patient's become refractory to treatment because they develop antibodies to the toxin. In this event, therapy needs to be modified.  I have read the above information and consent to the administration of botulism toxin.    BOTOX PROCEDURE NOTE FOR MIGRAINE HEADACHE    Contraindications and precautions discussed with patient(above). Aseptic procedure was observed and patient tolerated procedure. Procedure performed by Dr. Artemio Aly  The condition has existed for more than 6 months, and pt does not have a diagnosis of ALS, Myasthenia Gravis or Lambert-Eaton Syndrome.  Risks and benefits of injections discussed and pt agrees to proceed with the procedure.  Written consent obtained  These injections are medically necessary. Pt  receives good benefits from these injections. These injections do not cause sedations or hallucinations which the oral therapies may cause.  Description of procedure:  The patient was placed in a sitting position. The standard protocol was used for Botox as follows, with 5 units of Botox injected at each site:   -Procerus muscle, midline injection  -Corrugator muscle, bilateral injection  -Frontalis muscle, bilateral injection, with 2 sites each side, medial injection was performed in the upper one third of the frontalis muscle, in the region vertical from the medial inferior edge of the superior orbital rim. The lateral injection was again in the upper one third of the forehead vertically above the lateral limbus of the cornea, 1.5 cm lateral to the medial injection site.  -Temporalis muscle injection, 4 sites, bilaterally. The  first injection was 3 cm above the tragus of the ear, second injection site was 1.5 cm to 3 cm up from the first injection site in line with the tragus of the ear. The third injection site was 1.5-3 cm forward between the  first 2 injection sites. The fourth injection site was 1.5 cm posterior to the second injection site.   -Occipitalis muscle injection, 3 sites, bilaterally. The first injection was done one half way between the occipital protuberance and the tip of the mastoid process behind the ear. The second injection site was done lateral and superior to the first, 1 fingerbreadth from the first injection. The third injection site was 1 fingerbreadth superiorly and medially from the first injection site.  -Cervical paraspinal muscle injection, 2 sites, bilateral knee first injection site was 1 cm from the midline of the cervical spine, 3 cm inferior to the lower border of the occipital protuberance. The second injection site was 1.5 cm superiorly and laterally to the first injection site.  -Trapezius muscle injection was performed at 3 sites, bilaterally. The first injection site was in the upper trapezius muscle halfway between the inflection point of the neck, and the acromion. The second injection site was one half way between the acromion and the first injection site. The third injection was done between the first injection site and the inflection point of the neck.   Will return for repeat injection in 3 months.   200 units of Botox was used, any Botox not injected was wasted. The patient tolerated the procedure well, there were no complications of the above procedure.

## 2024-02-29 ENCOUNTER — Other Ambulatory Visit: Payer: Self-pay | Admitting: Neurology

## 2024-03-15 ENCOUNTER — Encounter: Payer: Self-pay | Admitting: Neurology

## 2024-03-17 ENCOUNTER — Other Ambulatory Visit: Payer: Self-pay | Admitting: Neurology

## 2024-03-17 MED ORDER — METHYLPREDNISOLONE 4 MG PO TBPK: ORAL_TABLET | ORAL | 1 refills | Status: DC

## 2024-03-18 NOTE — Telephone Encounter (Signed)
 Pt here for 1500 infusion for migraine cocktail.  She was seen for botox 01-29-2024.   Pt stated that her migraine started Last Tues and full blown last Thursday.  Level 10+, she has taken her  medications for migraines helped some but still not gotten rid of it.  Frontal/orbital level 7 now.  Some nausea on and off.  Her first dose of steroids yesterday.  Per Dr. Lucia Gaskins ok for Depacon 1 gm IV, 125mg  solumedrol, zofran 4mg  po, toradol 30mg .  Pt instructed that she is not to take the remaining oral steroids as getting IV.  She was ok with this and appreciated asking.  Orders signed and given to intrafusion.

## 2024-04-09 ENCOUNTER — Other Ambulatory Visit (HOSPITAL_COMMUNITY): Payer: Self-pay

## 2024-04-09 ENCOUNTER — Other Ambulatory Visit: Payer: Self-pay

## 2024-04-11 ENCOUNTER — Other Ambulatory Visit (HOSPITAL_COMMUNITY): Payer: Self-pay

## 2024-04-11 ENCOUNTER — Other Ambulatory Visit: Payer: Self-pay

## 2024-04-11 NOTE — Progress Notes (Signed)
 Specialty Pharmacy Refill Coordination Note  Hayley Hamilton is a 48 y.o. female contacted today regarding refills of specialty medication(s) OnabotulinumtoxinA (BOTOX)   Patient requested Courier to Provider Office   Delivery date: 04/23/24   Verified address: [facility] [address]  [place],  [place] [address]   Medication will be filled on 04/22/24.

## 2024-04-16 ENCOUNTER — Telehealth: Payer: BC Managed Care – PPO | Admitting: Neurology

## 2024-04-20 ENCOUNTER — Other Ambulatory Visit (HOSPITAL_COMMUNITY): Payer: Self-pay

## 2024-04-22 ENCOUNTER — Other Ambulatory Visit: Payer: Self-pay

## 2024-04-25 NOTE — Progress Notes (Unsigned)
 05/02/2024 mychart message: Hayley Hamilton, I am so sorry about your migraines. I still feel there is a lot we can provide, you just had your second botox  and it take up to 3 to get maximum efficacy and we have other medications to try. We also referred you for a sleep appointment, did you ever see our sleep team for possible sleep apnea? So I would not be able to fill out any disability forms in this case I am sorry. But I am happy to keep seeing you and treating you for migraines. I would also recommend Cognitive Behavioral Therapy(CBT),  CBT can improve headache frequency and provide cognitive strategies to enhance headache sufferers' ability to engage in behaviors that enhance their self-management of headaches. You can check with your insurance provider on therapists they pay for and call them to see if they provide CBT and schedule directly with them. Thanks, Dr. Tresia Fruit  04/29/24 Hayley Hamilton returns for second Botox  procedure. First procedure performed with Dr Tresia Fruit 01/2024. She reports significant improvement in intensity and frequency of migraines for about 6 weeks but then headache returned. She is having near daily headaches. She continues Emgality , Nurtec and or rizatriptan .     Consent Form Botulism Toxin Injection For Chronic Migraine    Reviewed orally with patient, additionally signature is on file:  Botulism toxin has been approved by the Federal drug administration for treatment of chronic migraine. Botulism toxin does not cure chronic migraine and it may not be effective in some patients.  The administration of botulism toxin is accomplished by injecting a small amount of toxin into the muscles of the neck and head. Dosage must be titrated for each individual. Any benefits resulting from botulism toxin tend to wear off after 3 months with a repeat injection required if benefit is to be maintained. Injections are usually done every 3-4 months with maximum effect peak achieved by about 2 or 3  weeks. Botulism toxin is expensive and you should be sure of what costs you will incur resulting from the injection.  The side effects of botulism toxin use for chronic migraine may include:   -Transient, and usually mild, facial weakness with facial injections  -Transient, and usually mild, head or neck weakness with head/neck injections  -Reduction or loss of forehead facial animation due to forehead muscle weakness  -Eyelid drooping  -Dry eye  -Pain at the site of injection or bruising at the site of injection  -Double vision  -Potential unknown long term risks   Contraindications: You should not have Botox  if you are pregnant, nursing, allergic to albumin, have an infection, skin condition, or muscle weakness at the site of the injection, or have myasthenia gravis, Lambert-Eaton syndrome, or ALS.  It is also possible that as with any injection, there may be an allergic reaction or no effect from the medication. Reduced effectiveness after repeated injections is sometimes seen and rarely infection at the injection site may occur. All care will be taken to prevent these side effects. If therapy is given over a long time, atrophy and wasting in the muscle injected may occur. Occasionally the patient's become refractory to treatment because they develop antibodies to the toxin. In this event, therapy needs to be modified.  I have read the above information and consent to the administration of botulism toxin.    BOTOX  PROCEDURE NOTE FOR MIGRAINE HEADACHE  Contraindications and precautions discussed with patient(above). Aseptic procedure was observed and patient tolerated procedure. Procedure performed by Terrilyn Fick, FNP-C.  The condition has existed for more than 6 months, and pt does not have a diagnosis of ALS, Myasthenia Gravis or Lambert-Eaton Syndrome.  Risks and benefits of injections discussed and pt agrees to proceed with the procedure.  Written consent obtained  These injections  are medically necessary. Pt  receives good benefits from these injections. These injections do not cause sedations or hallucinations which the oral therapies may cause.   Description of procedure:  The patient was placed in a sitting position. The standard protocol was used for Botox  as follows, with 5 units of Botox  injected at each site:  -Procerus muscle, midline injection  -Corrugator muscle, bilateral injection  -Frontalis muscle, bilateral injection, with 2 sites each side, medial injection was performed in the upper one third of the frontalis muscle, in the region vertical from the medial inferior edge of the superior orbital rim. The lateral injection was again in the upper one third of the forehead vertically above the lateral limbus of the cornea, 1.5 cm lateral to the medial injection site.  -Temporalis muscle injection, 4 sites, bilaterally. The first injection was 3 cm above the tragus of the ear, second injection site was 1.5 cm to 3 cm up from the first injection site in line with the tragus of the ear. The third injection site was 1.5-3 cm forward between the first 2 injection sites. The fourth injection site was 1.5 cm posterior to the second injection site. 5th site laterally in the temporalis  muscleat the level of the outer canthus.  -Occipitalis muscle injection, 3 sites, bilaterally. The first injection was done one half way between the occipital protuberance and the tip of the mastoid process behind the ear. The second injection site was done lateral and superior to the first, 1 fingerbreadth from the first injection. The third injection site was 1 fingerbreadth superiorly and medially from the first injection site.  -Cervical paraspinal muscle injection, 2 sites, bilaterally. The first injection site was 1 cm from the midline of the cervical spine, 3 cm inferior to the lower border of the occipital protuberance. The second injection site was 1.5 cm superiorly and laterally to  the first injection site.  -Trapezius muscle injection was performed at 3 sites, bilaterally. The first injection site was in the upper trapezius muscle halfway between the inflection point of the neck, and the acromion. The second injection site was one half way between the acromion and the first injection site. The third injection was done between the first injection site and the inflection point of the neck.   Will return for repeat injection in 3 months.   A total of 200 units of Botox  was prepared, 155 units of Botox  was injected as documented above, any Botox  not injected was wasted. The patient tolerated the procedure well, there were no complications of the above procedure.

## 2024-04-29 ENCOUNTER — Ambulatory Visit: Payer: 59 | Admitting: Family Medicine

## 2024-04-29 DIAGNOSIS — G43711 Chronic migraine without aura, intractable, with status migrainosus: Secondary | ICD-10-CM | POA: Diagnosis not present

## 2024-04-29 MED ORDER — ONABOTULINUMTOXINA 200 UNITS IJ SOLR
155.0000 [IU] | Freq: Once | INTRAMUSCULAR | Status: AC
  Administered 2024-04-29: 155 [IU] via INTRAMUSCULAR

## 2024-04-29 NOTE — Progress Notes (Signed)
 Botox -200U x 1vial Lot: [id number] Expiration:03/2026 NDC: 0023-3921-02  Bacteriostatic 0.9% Sodium Chloride- 4mL total [id number] Expiration:10/26/2024 NDC: 6962-9528-41  Specialty pharmacy  Witnessed by: Marsden Zaino,RN

## 2024-05-01 ENCOUNTER — Encounter: Payer: Self-pay | Admitting: Neurology

## 2024-05-30 ENCOUNTER — Institutional Professional Consult (permissible substitution): Admitting: Neurology

## 2024-06-04 ENCOUNTER — Emergency Department (HOSPITAL_BASED_OUTPATIENT_CLINIC_OR_DEPARTMENT_OTHER): Payer: Worker's Compensation | Admitting: Radiology

## 2024-06-04 ENCOUNTER — Emergency Department (HOSPITAL_BASED_OUTPATIENT_CLINIC_OR_DEPARTMENT_OTHER): Payer: Worker's Compensation

## 2024-06-04 ENCOUNTER — Encounter (HOSPITAL_BASED_OUTPATIENT_CLINIC_OR_DEPARTMENT_OTHER): Payer: Self-pay

## 2024-06-04 ENCOUNTER — Emergency Department (HOSPITAL_BASED_OUTPATIENT_CLINIC_OR_DEPARTMENT_OTHER)
Admission: EM | Admit: 2024-06-04 | Discharge: 2024-06-04 | Disposition: A | Discharge status: Home / Self Care | Payer: Worker's Compensation | Attending: Emergency Medicine | Admitting: Emergency Medicine

## 2024-06-04 ENCOUNTER — Other Ambulatory Visit: Payer: Self-pay

## 2024-06-04 DIAGNOSIS — M25512 Pain in left shoulder: Secondary | ICD-10-CM | POA: Insufficient documentation

## 2024-06-04 DIAGNOSIS — R519 Headache, unspecified: Secondary | ICD-10-CM | POA: Diagnosis present

## 2024-06-04 DIAGNOSIS — M542 Cervicalgia: Secondary | ICD-10-CM | POA: Insufficient documentation

## 2024-06-04 DIAGNOSIS — W010XXA Fall on same level from slipping, tripping and stumbling without subsequent striking against object, initial encounter: Secondary | ICD-10-CM | POA: Insufficient documentation

## 2024-06-04 DIAGNOSIS — M25552 Pain in left hip: Secondary | ICD-10-CM | POA: Insufficient documentation

## 2024-06-04 DIAGNOSIS — R110 Nausea: Secondary | ICD-10-CM | POA: Diagnosis not present

## 2024-06-04 DIAGNOSIS — Y990 Civilian activity done for income or pay: Secondary | ICD-10-CM | POA: Insufficient documentation

## 2024-06-04 DIAGNOSIS — W19XXXA Unspecified fall, initial encounter: Secondary | ICD-10-CM

## 2024-06-04 MED ORDER — ACETAMINOPHEN 325 MG PO TABS
650.0000 mg | ORAL_TABLET | Freq: Once | ORAL | Status: AC
  Administered 2024-06-04: 650 mg via ORAL
  Filled 2024-06-04: qty 2

## 2024-06-04 NOTE — ED Triage Notes (Addendum)
 Pt reports mechanical fall today at work, slipped on wet floor, landed on left side. C/o L anterior shoulder, L hip, cervical tenderness, L sided facial/eye pain. No obvious swelling/bruising/deformity. Can't recall if hit head. Denies thinners, denies numbness/tingling/incontinence. Hx spinal fusion 2023. Endorses nausea. Sent by UC for further eval.   Soft collar in place in triage

## 2024-06-04 NOTE — Discharge Instructions (Signed)
 As discussed, your imaging is reassuring.  No signs of broken bones.  You could have a concussion, this is something that is diagnosed based on symptoms, it is not something that we see on imaging.  I have attached information in the discharge paperwork you can review about concussions.  Alternate between ibuprofen 600 and Tylenol  500 every 4 hours as needed for pain.  You might be feeling more sore tomorrow and the next day, this is normal.  Follow-up with your primary care fighter in the next week for reevaluation.  Return to the ED for symptoms worsen in the meantime.

## 2024-06-04 NOTE — ED Provider Notes (Signed)
 [place] EMERGENCY DEPARTMENT AT [facility] Provider Note   CSN: [id number] Arrival date & time: 06/04/24  1641     History  Chief Complaint  Patient presents with   Hayley Hamilton Hayley Hamilton is a 48 y.o. female with no significant past medical history presents to the ED today after mechanical fall.  She reports that she slipped on the floor while at work and landed on her left side.  She did hit her head but did not lose consciousness.  No blood thinner use.  Endorses headache, left-sided facial pain around her eye, head and neck pain, left shoulder pain, and left hip pain.  She is able to ambulate and move all extremities independently.  Since she is having headache and also is endorsing nausea she was sent from urgent care for further evaluation to rule out concussion.  She was given a soft collar in triage to wear for her neck pain, she was not wearing at the time of my evaluation.    Home Medications Prior to Admission medications   Medication Sig Start Date End Date Taking? Authorizing Provider  botulinum toxin Type A  (BOTOX ) 200 units injection Provider to inject 155 units into the muscles of the head and neck every 12 weeks. Discard remainder. 01/09/24   Glory Larsen, MD  escitalopram (LEXAPRO) 20 MG tablet Take 20 mg by mouth daily.    [provider]  LORazepam (ATIVAN) 0.5 MG tablet Take 0.5 mg by mouth every 8 (eight) hours as needed for anxiety.    [provider]  methocarbamol (ROBAXIN) 500 MG tablet Take 500 mg by mouth 3 (three) times daily as needed. 07/30/22   [provider]  ondansetron  (ZOFRAN -ODT) 4 MG disintegrating tablet DISSOLVE 1 TO 2 TABLETS(4 TO 8 MG) ON THE TONGUE EVERY 8 HOURS AS NEEDED 02/29/24   Glory Larsen, MD  Rimegepant Sulfate (NURTEC) 75 MG TBDP Take 1 tablet (75 mg total) by mouth every other day. For migraines. Take as close to onset of migraine as possible. One daily maximum. 12/06/23   Glory Larsen, MD   rizatriptan  (MAXALT -MLT) 10 MG disintegrating tablet Take 1 tablet (10 mg total) by mouth as needed for migraine. May repeat in 2 hours if needed 08/01/23   Glory Larsen, MD  zolpidem Lourdes Roy) 10 MG tablet  05/07/15   [provider]      Allergies    Codeine    Review of Systems   Review of Systems  Neurological:  Positive for headaches.  All other systems reviewed and are negative.   Physical Exam Updated Vital Signs BP 137/78   Pulse 60   Temp 97.9 F (36.6 C) (Oral)   Resp 18   Ht 5' 6 (1.676 m)   Wt 81.6 kg   LMP 08/20/2012   SpO2 100%   BMI 29.05 kg/m  Physical Exam Vitals and nursing note reviewed.  Constitutional:      General: She is not in acute distress.    Appearance: Normal appearance.  HENT:     Head: Normocephalic and atraumatic.     Mouth/Throat:     Mouth: Mucous membranes are moist.  Eyes:     Conjunctiva/sclera: Conjunctivae normal.     Pupils: Pupils are equal, round, and reactive to light.  Neck:     Comments: TTP of left paravertebral muscles. No midline tenderness. ROM of neck intact. Cardiovascular:     Rate and Rhythm: Normal rate and regular  rhythm.     Pulses: Normal pulses.  Pulmonary:     Effort: Pulmonary effort is normal.     Breath sounds: Normal breath sounds.  Abdominal:     Palpations: Abdomen is soft.     Tenderness: There is no abdominal tenderness.  Musculoskeletal:        General: Tenderness present. No swelling. Normal range of motion.     Cervical back: Tenderness present.     Comments: Tenderness to palpation of left posterior shoulder and hip.  Range of motion, strength, sensation of upper lower extremities are intact bilaterally.  No midline tenderness to palpation of the spine.  Skin:    General: Skin is warm and dry.     Findings: No rash.  Neurological:     General: No focal deficit present.     Mental Status: She is alert.     Sensory: No sensory deficit.     Motor: No weakness.  Psychiatric:         Mood and Affect: Mood normal.        Behavior: Behavior normal.    ED Results / Procedures / Treatments   Labs (all labs ordered are listed, but only abnormal results are displayed) Labs Reviewed - No data to display  EKG None  Radiology CT Head Wo Contrast Result Date: 06/04/2024 CLINICAL DATA:  Head trauma, focal neuro findings (Age 48-64y); Facial trauma, blunt; Neck trauma (Age >= 65y) mechanical fall today at work, slipped on wet floor, landed on left side. C/o L anterior shoulder, L hip, cervical tenderness, L sided facial/eye pain. No obvious swelling/bruising/deformity EXAM: CT HEAD WITHOUT CONTRAST CT MAXILLOFACIAL WITHOUT CONTRAST CT CERVICAL SPINE WITHOUT CONTRAST TECHNIQUE: Multidetector CT imaging of the head, cervical spine, and maxillofacial structures were performed using the standard protocol without intravenous contrast. Multiplanar CT image reconstructions of the cervical spine and maxillofacial structures were also generated. RADIATION DOSE REDUCTION: This exam was performed according to the departmental dose-optimization program which includes automated exposure control, adjustment of the mA and/or kV according to patient size and/or use of iterative reconstruction technique. COMPARISON:  MRI head 08/08/2023 trauma MRI cervical spine 03/31/2023 FINDINGS: CT HEAD FINDINGS Brain: No evidence of large-territorial acute infarction. No parenchymal hemorrhage. No mass lesion. No extra-axial collection. No mass effect or midline shift. No hydrocephalus. Basilar cisterns are patent. Vascular: No hyperdense vessel. Skull: No acute fracture or focal lesion. Other: None. CT MAXILLOFACIAL FINDINGS Osseous: No fracture or mandibular dislocation. No destructive process. Sinuses/Orbits: Paranasal sinuses and mastoid air cells are clear. The orbits are unremarkable. Soft tissues: Negative. CT CERVICAL SPINE FINDINGS Alignment: Normal. Skull base and vertebrae: C5-C6 anterior cervical  discectomy and fusion. No acute fracture. No aggressive appearing focal osseous lesion or focal pathologic process. Soft tissues and spinal canal: No prevertebral fluid or swelling. No visible canal hematoma. Upper chest: Unremarkable. Other: None. IMPRESSION: 1. No acute intracranial abnormality. 2.  No acute displaced facial fracture. 3. No acute displaced fracture or traumatic listhesis of the cervical spine. Electronically Signed   By: Morgane  Naveau M.D.   On: 06/04/2024 18:11   CT Cervical Spine Wo Contrast Result Date: 06/04/2024 CLINICAL DATA:  Head trauma, focal neuro findings (Age 3-64y); Facial trauma, blunt; Neck trauma (Age >= 65y) mechanical fall today at work, slipped on wet floor, landed on left side. C/o L anterior shoulder, L hip, cervical tenderness, L sided facial/eye pain. No obvious swelling/bruising/deformity EXAM: CT HEAD WITHOUT CONTRAST CT MAXILLOFACIAL WITHOUT CONTRAST CT CERVICAL SPINE WITHOUT CONTRAST  TECHNIQUE: Multidetector CT imaging of the head, cervical spine, and maxillofacial structures were performed using the standard protocol without intravenous contrast. Multiplanar CT image reconstructions of the cervical spine and maxillofacial structures were also generated. RADIATION DOSE REDUCTION: This exam was performed according to the departmental dose-optimization program which includes automated exposure control, adjustment of the mA and/or kV according to patient size and/or use of iterative reconstruction technique. COMPARISON:  MRI head 08/08/2023 trauma MRI cervical spine 03/31/2023 FINDINGS: CT HEAD FINDINGS Brain: No evidence of large-territorial acute infarction. No parenchymal hemorrhage. No mass lesion. No extra-axial collection. No mass effect or midline shift. No hydrocephalus. Basilar cisterns are patent. Vascular: No hyperdense vessel. Skull: No acute fracture or focal lesion. Other: None. CT MAXILLOFACIAL FINDINGS Osseous: No fracture or mandibular dislocation. No  destructive process. Sinuses/Orbits: Paranasal sinuses and mastoid air cells are clear. The orbits are unremarkable. Soft tissues: Negative. CT CERVICAL SPINE FINDINGS Alignment: Normal. Skull base and vertebrae: C5-C6 anterior cervical discectomy and fusion. No acute fracture. No aggressive appearing focal osseous lesion or focal pathologic process. Soft tissues and spinal canal: No prevertebral fluid or swelling. No visible canal hematoma. Upper chest: Unremarkable. Other: None. IMPRESSION: 1. No acute intracranial abnormality. 2.  No acute displaced facial fracture. 3. No acute displaced fracture or traumatic listhesis of the cervical spine. Electronically Signed   By: Morgane  Naveau M.D.   On: 06/04/2024 18:11   CT Maxillofacial Wo Contrast Result Date: 06/04/2024 CLINICAL DATA:  Head trauma, focal neuro findings (Age 6-64y); Facial trauma, blunt; Neck trauma (Age >= 65y) mechanical fall today at work, slipped on wet floor, landed on left side. C/o L anterior shoulder, L hip, cervical tenderness, L sided facial/eye pain. No obvious swelling/bruising/deformity EXAM: CT HEAD WITHOUT CONTRAST CT MAXILLOFACIAL WITHOUT CONTRAST CT CERVICAL SPINE WITHOUT CONTRAST TECHNIQUE: Multidetector CT imaging of the head, cervical spine, and maxillofacial structures were performed using the standard protocol without intravenous contrast. Multiplanar CT image reconstructions of the cervical spine and maxillofacial structures were also generated. RADIATION DOSE REDUCTION: This exam was performed according to the departmental dose-optimization program which includes automated exposure control, adjustment of the mA and/or kV according to patient size and/or use of iterative reconstruction technique. COMPARISON:  MRI head 08/08/2023 trauma MRI cervical spine 03/31/2023 FINDINGS: CT HEAD FINDINGS Brain: No evidence of large-territorial acute infarction. No parenchymal hemorrhage. No mass lesion. No extra-axial collection. No mass  effect or midline shift. No hydrocephalus. Basilar cisterns are patent. Vascular: No hyperdense vessel. Skull: No acute fracture or focal lesion. Other: None. CT MAXILLOFACIAL FINDINGS Osseous: No fracture or mandibular dislocation. No destructive process. Sinuses/Orbits: Paranasal sinuses and mastoid air cells are clear. The orbits are unremarkable. Soft tissues: Negative. CT CERVICAL SPINE FINDINGS Alignment: Normal. Skull base and vertebrae: C5-C6 anterior cervical discectomy and fusion. No acute fracture. No aggressive appearing focal osseous lesion or focal pathologic process. Soft tissues and spinal canal: No prevertebral fluid or swelling. No visible canal hematoma. Upper chest: Unremarkable. Other: None. IMPRESSION: 1. No acute intracranial abnormality. 2.  No acute displaced facial fracture. 3. No acute displaced fracture or traumatic listhesis of the cervical spine. Electronically Signed   By: Morgane  Naveau M.D.   On: 06/04/2024 18:11   DG Lumbar Spine Complete Result Date: 06/04/2024 CLINICAL DATA:  fall EXAM: LUMBAR SPINE - COMPLETE 4+ VIEW COMPARISON:  X-ray lumbar spine 08/24/2012 FINDINGS: There is no evidence of lumbar spine fracture. Right L5-S1 pseudoarthrosis. Alignment is normal. L5-S1 intervertebral disc space narrowing. IMPRESSION: No acute displaced fracture  or traumatic listhesis of the lumbar spine. Electronically Signed   By: Morgane  Naveau M.D.   On: 06/04/2024 17:48   DG Hip Unilat With Pelvis 2-3 Views Left Result Date: 06/04/2024 CLINICAL DATA:  fall EXAM: DG HIP (WITH OR WITHOUT PELVIS) 2-3V LEFT COMPARISON:  None Available. FINDINGS: There is no evidence of hip fracture or dislocation of the left hip. No acute displaced fracture or dislocation of the right hip on frontal view. No acute displaced fracture or diastasis of the bones of the pelvis. There is no evidence of arthropathy or other focal bone abnormality. L5-S1 right pseudoarthrosis. IMPRESSION: Negative for acute  traumatic injury. Electronically Signed   By: Morgane  Naveau M.D.   On: 06/04/2024 17:46   DG Shoulder Left Result Date: 06/04/2024 CLINICAL DATA:  Mechanical fall with left shoulder pain EXAM: LEFT SHOULDER - 3 VIEW COMPARISON:  None Available. FINDINGS: There is no evidence of fracture or dislocation. There is no evidence of arthropathy or other focal bone abnormality. Soft tissues are unremarkable. IMPRESSION: No acute fracture or dislocation. Electronically Signed   By: Limin  Xu M.D.   On: 06/04/2024 17:45    Procedures Procedures    Medications Ordered in ED Medications  acetaminophen  (TYLENOL ) tablet 650 mg (650 mg Oral Given 06/04/24 2140)    ED Course/ Medical Decision Making/ A&P                                 Medical Decision Making Amount and/or Complexity of Data Reviewed Radiology: ordered.  Risk OTC drugs.   This patient presents to the ED for concern of fall, this involves an extensive number of treatment options, and is a complaint that carries with it a high risk of complications and morbidity.   Differential diagnosis includes: ICH, concussion, fracture, dislocation, contusion, hematoma, etc.   Comorbidities  See HPI above   Additional History  Additional history obtained from prior records   Imaging Studies  I ordered imaging studies including CT head, maxillofacial, cervical spine. X-rays of left shoulder, lumbar spine, and left hip  I independently visualized and interpreted imaging which showed:  CT head shows no acute intracranial normality.  No acute displaced facial fracture on maxillofacial CT.   No acute displaced fracture or traumatic listhesis of cervical spine. No acute osseous abnormalities found on left hip x-ray. No acute fracture or dislocation of left shoulder x-ray. No acute displaced fracture or traumatic listhesis of lumbar spine. I agree with the radiologist interpretation   Problem List / ED Course / Critical  Interventions / Medication Management  Patient reports that she slipped and fell on wet floor at work today, landing on her left side.  She was sent to urgent care for Worker's Comp. but was advised to come here for further evaluation as she was endorsing headaches and nausea and they told her she could possibly of the concussion. In triage she reported left-sided facial pain which she denies to my evaluation.  Has not take anything for pain prior to arrival. Has pain in the left shoulder and left hip but is able to move extremities independently.  No gross deformity, low suspicion for fractures or dislocations. I ordered medications including: Tylenol  for pain - medication given prior to discharge   Discussed findings with patient.  Told her signs and symptoms of concussions to look for, as it is not something we identify on imaging.  Patient expresses understanding.  Social Determinants of Health  Physical activity   Test / Admission - Considered  Patient is stable and safe for discharge home. Return precautions given.       Final Clinical Impression(s) / ED Diagnoses Final diagnoses:  Fall, initial encounter    Rx / DC Orders ED Discharge Orders     None         Sonnie Dusky, PA-C 06/04/24 2143    Scarlette Currier, MD 06/07/24 2315

## 2024-06-10 ENCOUNTER — Telehealth: Payer: Self-pay | Admitting: Neurology

## 2024-06-10 NOTE — Telephone Encounter (Signed)
 Received PA renewal request from [facility]. I completed PA form and faxed with notes to [phone].

## 2024-06-17 NOTE — Telephone Encounter (Signed)
 Received fax of approval, pt will continue to fill through [facility].  Auth#: [id number] (06/10/24-06/10/25)

## 2024-06-18 ENCOUNTER — Encounter: Payer: Self-pay | Admitting: Neurology

## 2024-06-18 ENCOUNTER — Ambulatory Visit: Admitting: Neurology

## 2024-06-18 VITALS — BP 118/68 | HR 90 | Ht 66.0 in | Wt 183.0 lb

## 2024-06-18 DIAGNOSIS — R0689 Other abnormalities of breathing: Secondary | ICD-10-CM

## 2024-06-18 DIAGNOSIS — R0683 Snoring: Secondary | ICD-10-CM | POA: Diagnosis not present

## 2024-06-18 DIAGNOSIS — Z820 Family history of epilepsy and other diseases of the nervous system: Secondary | ICD-10-CM

## 2024-06-18 DIAGNOSIS — Z9189 Other specified personal risk factors, not elsewhere classified: Secondary | ICD-10-CM | POA: Diagnosis not present

## 2024-06-18 DIAGNOSIS — G4719 Other hypersomnia: Secondary | ICD-10-CM

## 2024-06-18 DIAGNOSIS — R519 Headache, unspecified: Secondary | ICD-10-CM | POA: Diagnosis not present

## 2024-06-18 DIAGNOSIS — E663 Overweight: Secondary | ICD-10-CM

## 2024-06-18 DIAGNOSIS — R351 Nocturia: Secondary | ICD-10-CM

## 2024-06-18 DIAGNOSIS — G4700 Insomnia, unspecified: Secondary | ICD-10-CM

## 2024-06-18 NOTE — Progress Notes (Signed)
 Subjective:    Patient ID: Hayley Hamilton is a 48 y.o. female.  HPI    True Mar, MD, PhD [facility] [address] [address] [place], [place] [address]  Dear Hayley Hamilton,  I saw your patient, Hayley Hamilton, upon your kind request in my sleep clinic today for initial consultation of her sleep disorder, in particular, concern for underlying obstructive sleep apnea.  The patient is unaccompanied today.  As you know, [name] is a 48 year old female with an underlying medical history of migraine headaches, chronic neck pain with status post neck fusion, hyperlipidemia, memory complaints, and obesity, who reports snoring and excessive daytime somnolence, as well as episodes of gasping for air at night; this has been ongoing for the past 6+ mo.  Her Epworth sleepiness score is 9 out of 24, fatigue severity score is 63 out of 63.  I reviewed your office note from 08/01/2023.  She had an overnight pulse oximetry test on 01/20/2024 which showed an average oxygen saturation of 94.4%, O2 nadir 87% with time below or at 88% saturation of 21 seconds.  For her migraine headaches, she receives regular Botox  injections through our office. She has been working on weight loss and has lost about 36 lb in the last 9 mo. She lives with her husband and 2 of her 3 kids. 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 She quit smoking some 17 y ago, drinks alcohol occasionally and limits her caffeine to 1-2 c coffee/day. He has chronic difficulty initiating and maintaining sleep and has been on nightly Ambien 10 mg strength for the past 17 years.  This is through her GYN.  Her Past Medical History Is Significant For: Past Medical History:  Diagnosis Date   Breast mass 05/02/2017   bilat breast masses and lt axilla    Her Past Surgical History Is Significant For: Past Surgical History:  Procedure Laterality Date   ABDOMINAL HYSTERECTOMY     ACDF  09/2022    APPENDECTOMY  2003   BREAST EXCISIONAL BIOPSY Right    COLONOSCOPY      Her Family History Is Significant For: Family History  Problem Relation Age of Onset   Sleep apnea Mother    Colon polyps Father    Colon cancer Paternal Uncle    Colon polyps Paternal Uncle    Diabetes Maternal Grandmother    Diabetes Maternal Grandfather    Diabetes Paternal Grandmother    Irritable bowel syndrome Paternal Grandmother    Colon cancer Paternal Grandfather    Colon polyps Paternal Grandfather    Diabetes Paternal Grandfather    Heart disease Paternal Grandfather    Migraines Neg Hx     Her Social History Is Significant For: Social History   Socioeconomic History   Marital status: Married    Spouse name: Not on file   Number of children: Not on file   Years of education: Not on file   Highest education level: Not on file  Occupational History   Not on file  Tobacco Use   Smoking status: Former   Smokeless tobacco: Never   Tobacco comments:    quit 2006-2007  Vaping Use   Vaping status: Never Used  Substance and Sexual Activity   Alcohol use: Yes    Alcohol/week: 1.0 - 2.0 standard drink of alcohol    Types: 1 - 2 Standard drinks or equivalent per week    Comment: social   Drug  use: No   Sexual activity: Not on file  Other Topics Concern   Not on file  Social History Narrative   Caffeine: 2 cups coffee in the morning   Right handed   Pt lives with family    Pt works    Social Drivers of Corporate investment banker Strain: Not on file  Food Insecurity: Not on file  Transportation Needs: Not on file  Physical Activity: Not on file  Stress: Not on file  Social Connections: Not on file    Her Allergies Are:  Allergies  Allergen Reactions   Codeine     In the past upset stomach, has had it sense with no problem  :   Her Current Medications Are:  Outpatient Encounter Medications as of 06/18/2024  Medication Sig   botulinum toxin Type A  (BOTOX ) 200 units  injection Provider to inject 155 units into the muscles of the head and neck every 12 weeks. Discard remainder.   escitalopram (LEXAPRO) 20 MG tablet Take 20 mg by mouth daily.   LORazepam (ATIVAN) 0.5 MG tablet Take 0.5 mg by mouth every 8 (eight) hours as needed for anxiety.   ondansetron  (ZOFRAN -ODT) 4 MG disintegrating tablet DISSOLVE 1 TO 2 TABLETS(4 TO 8 MG) ON THE TONGUE EVERY 8 HOURS AS NEEDED   rizatriptan  (MAXALT -MLT) 10 MG disintegrating tablet Take 1 tablet (10 mg total) by mouth as needed for migraine. May repeat in 2 hours if needed   zolpidem (AMBIEN) 10 MG tablet    methocarbamol (ROBAXIN) 500 MG tablet Take 500 mg by mouth 3 (three) times daily as needed.   Rimegepant Sulfate (NURTEC) 75 MG TBDP Take 1 tablet (75 mg total) by mouth every other day. For migraines. Take as close to onset of migraine as possible. One daily maximum.   Facility-Administered Encounter Medications as of 06/18/2024  Medication   Galcanezumab -gnlm SOAJ 240 mg  :   Review of Systems:  Out of a complete 14 point review of systems, all are reviewed and negative with the exception of these symptoms as listed below:  Review of Systems  Neurological:        Pt here for sleep consult Pt snores,fatigue,headaches Pt denies sleep study,cpap machine ,hypertension    ESS:9 FSS 63     Objective:  Neurological Exam  Physical Exam Physical Examination:   Vitals:   06/18/24 0916  BP: 118/68  Pulse: 90    General Examination: The patient is a very pleasant 48 y.o. female in no acute distress. She appears well-developed and well-nourished and well groomed.   HEENT: Normocephalic, atraumatic, pupils are equal, round and reactive to light, extraocular tracking is good without limitation to gaze excursion or nystagmus noted. Hearing is grossly intact. Face is symmetric with normal facial animation. Speech is clear with no dysarthria noted. There is no hypophonia. There is no lip, neck/head, jaw or voice  tremor. Neck is supple with full range of passive and active motion. There are no carotid bruits on auscultation. Oropharynx exam reveals: No significant mouth dryness, good dental hygiene, mild airway crowding secondary to small airway entry, tonsillar size small, slightly prominent uvula, Mallampati class I, neck circumference 14 1 eighths inches, no significant overbite, very slight.  Tongue protrudes centrally and palate elevates symmetrically.   Chest: Clear to auscultation without wheezing, rhonchi or crackles noted.  Heart: S1+S2+0, regular and normal without murmurs, rubs or gallops noted.   Abdomen: Soft, non-tender and non-distended.  Extremities: There is no pitting edema  in the distal lower extremities bilaterally.   Skin: Warm and dry without trophic changes noted.   Musculoskeletal: exam reveals no obvious joint deformities.   Neurologically:  Mental status: The patient is awake, alert and oriented in all 4 spheres. Her immediate and remote memory, attention, language skills and fund of knowledge are appropriate. There is no evidence of aphasia, agnosia, apraxia or anomia. Speech is clear with normal prosody and enunciation. Thought process is linear. Mood is normal and affect is normal.  Cranial nerves II - XII are as described above under HEENT exam.  Motor exam: Normal bulk, strength and tone is noted. There is no obvious action or resting tremor.  Fine motor skills and coordination: grossly intact.  Cerebellar testing: No dysmetria or intention tremor. There is no truncal or gait ataxia.  Sensory exam: intact to light touch in the upper and lower extremities.  Gait, station and balance: She stands easily. No veering to one side is noted. No leaning to one side is noted. Posture is age-appropriate and stance is narrow based. Gait shows normal stride length and normal pace. No problems turning are noted.   Assessment and plan:  In summary, LIDDIE CHICHESTER is a 48 year old  female with an underlying medical history of migraine headaches, chronic neck pain with status post neck fusion, hyperlipidemia, memory complaints, and obesity, whowhose history and physical exam are concerning for sleep disordered breathing, particularly obstructive sleep apnea (OSA). A laboratory attended sleep study is typically considered gold standard for evaluation of sleep disordered breathing.   I had a long chat with the patient about my findings and the diagnosis of sleep apnea, particularly OSA, its prognosis and treatment options. We talked about medical/conservative treatments, surgical interventions and non-pharmacological approaches for symptom control. I explained, in particular, the risks and ramifications of untreated moderate to severe OSA, especially with respect to developing cardiovascular disease down the road, including congestive heart failure (CHF), difficult to treat hypertension, cardiac arrhythmias (particularly A-fib), neurovascular complications including TIA, stroke and dementia. Even type 2 diabetes has, in part, been linked to untreated OSA. Symptoms of untreated OSA may include (but may not be limited to) daytime sleepiness, nocturia (i.e. frequent nighttime urination), memory problems, mood irritability and suboptimally controlled or worsening mood disorder such as depression and/or anxiety, lack of energy, lack of motivation, physical discomfort, as well as recurrent headaches, especially morning or nocturnal headaches. We talked about the importance of maintaining a healthy lifestyle and striving for healthy weight. In addition, we talked about the importance of striving for and maintaining good sleep hygiene; she is advised that for women, the dose recommendation for Ambien is actually 5 mg rather than 10 mg. I recommended a sleep study at this time. I outlined the differences between a laboratory attended sleep study which is considered more comprehensive and accurate over  the option of a home sleep test (HST); the latter may lead to underestimation of sleep disordered breathing in some instances and does not help with diagnosing upper airway resistance syndrome and is not accurate enough to diagnose primary central sleep apnea typically. I outlined possible surgical and non-surgical treatment options of OSA, including the use of a positive airway pressure (PAP) device (i.e. CPAP, AutoPAP/APAP or BiPAP in certain circumstances), a custom-made dental device (aka oral appliance, which would require a referral to a specialist dentist or orthodontist typically, and is generally speaking not considered for patients with full dentures or edentulous state), upper airway surgical options, such as traditional UPPP (  which is not considered a first-line treatment) or the Inspire device (hypoglossal nerve stimulator, which would involve a referral for consultation with an ENT surgeon, after careful selection, following inclusion criteria - also not first-line treatment). I explained the PAP treatment option to the patient in detail, as this is generally considered first-line treatment.  The patient indicated that she would be willing to try PAP therapy, if the need arises. I explained the importance of being compliant with PAP treatment, not only for insurance purposes but primarily to improve patient's symptoms symptoms, and for the patient's long term health benefit, including to reduce Her cardiovascular risks longer-term.    We will pick up our discussion about the next steps and treatment options after testing.  We will keep her posted as to the test results by phone call and/or MyChart messaging where possible.  We will plan to follow-up in sleep clinic accordingly as well.  I answered all her questions today and the patient was in agreement.   I encouraged her to call with any interim questions, concerns, problems or updates or email us  through MyChart.  Generally speaking, sleep test  authorizations may take up to 2 weeks, sometimes less, sometimes longer, the patient is encouraged to get in touch with us  if they do not hear back from the sleep lab staff directly within the next 2 weeks.  Thank you very much for allowing me to participate in the care of this nice patient. If I can be of any further assistance to you please do not hesitate to talk to me.  Sincerely,   True Mar, MD, PhD

## 2024-06-18 NOTE — Patient Instructions (Signed)

## 2024-06-25 ENCOUNTER — Telehealth: Payer: Self-pay | Admitting: Neurology

## 2024-06-25 NOTE — Telephone Encounter (Signed)
 LVM    NPSG & HST aetna state no auth req

## 2024-06-27 NOTE — Telephone Encounter (Signed)
 Called the patient again, wasn't able to get a hold of her. Left a voicemail with my direct number.

## 2024-06-27 NOTE — Telephone Encounter (Signed)
 NPSG [employer]etna State no auth req   Patient is scheduled at [facility] For 07/23/24 at 9 pm.  Mailed packet and sent myhcart

## 2024-07-12 ENCOUNTER — Other Ambulatory Visit: Payer: Self-pay | Admitting: Pharmacy Technician

## 2024-07-12 ENCOUNTER — Other Ambulatory Visit: Payer: Self-pay

## 2024-07-12 NOTE — Progress Notes (Signed)
 Specialty Pharmacy Refill Coordination Note  Hayley Hamilton is a 48 y.o. female assessed today regarding refills of clinic administered specialty medication(s) OnabotulinumtoxinA  (BOTOX )   Clinic requested Courier to Provider Office   Delivery date: 07/16/24   Verified address: [facility] [address] [place], [place] [address]   Medication will be filled on 07/15/24.    Fill 7/21, Courier to [facility] 7/22 for appt 07/22/2024 3:30 PM $0 co-pay

## 2024-07-15 ENCOUNTER — Other Ambulatory Visit: Payer: Self-pay

## 2024-07-18 NOTE — Progress Notes (Signed)
 07/22/24 ALL: Lenette returns for Botox . She reports migraines have been much better. She has not had a migraine in the past 6 weeks. She continues Emgality , Nurtec and or rizatriptan .   She was seen in consult with Dr Buck for concerns of possible sleep apnea. Sleep study ordered and being performed tomorrow night. Mother has sleep apnea.   04/29/2024 ALL: Takia returns for second Botox  procedure. First procedure performed with Dr Ines 01/2024. She reports significant improvement in intensity and frequency of migraines for about 6 weeks but then headache returned. She is having near daily headaches. She continues Emgality , Nurtec and or rizatriptan .     Consent Form Botulism Toxin Injection For Chronic Migraine    Reviewed orally with patient, additionally signature is on file:  Botulism toxin has been approved by the Federal drug administration for treatment of chronic migraine. Botulism toxin does not cure chronic migraine and it may not be effective in some patients.  The administration of botulism toxin is accomplished by injecting a small amount of toxin into the muscles of the neck and head. Dosage must be titrated for each individual. Any benefits resulting from botulism toxin tend to wear off after 3 months with a repeat injection required if benefit is to be maintained. Injections are usually done every 3-4 months with maximum effect peak achieved by about 2 or 3 weeks. Botulism toxin is expensive and you should be sure of what costs you will incur resulting from the injection.  The side effects of botulism toxin use for chronic migraine may include:   -Transient, and usually mild, facial weakness with facial injections  -Transient, and usually mild, head or neck weakness with head/neck injections  -Reduction or loss of forehead facial animation due to forehead muscle weakness  -Eyelid drooping  -Dry eye  -Pain at the site of injection or bruising at the site of  injection  -Double vision  -Potential unknown long term risks   Contraindications: You should not have Botox  if you are pregnant, nursing, allergic to albumin, have an infection, skin condition, or muscle weakness at the site of the injection, or have myasthenia gravis, Lambert-Eaton syndrome, or ALS.  It is also possible that as with any injection, there may be an allergic reaction or no effect from the medication. Reduced effectiveness after repeated injections is sometimes seen and rarely infection at the injection site may occur. All care will be taken to prevent these side effects. If therapy is given over a long time, atrophy and wasting in the muscle injected may occur. Occasionally the patient's become refractory to treatment because they develop antibodies to the toxin. In this event, therapy needs to be modified.  I have read the above information and consent to the administration of botulism toxin.    BOTOX  PROCEDURE NOTE FOR MIGRAINE HEADACHE  Contraindications and precautions discussed with patient(above). Aseptic procedure was observed and patient tolerated procedure. Procedure performed by Greig Forbes, FNP-C.   The condition has existed for more than 6 months, and pt does not have a diagnosis of ALS, Myasthenia Gravis or Lambert-Eaton Syndrome.  Risks and benefits of injections discussed and pt agrees to proceed with the procedure.  Written consent obtained  These injections are medically necessary. Pt  receives good benefits from these injections. These injections do not cause sedations or hallucinations which the oral therapies may cause.   Description of procedure:  The patient was placed in a sitting position. The standard protocol was used for Botox   as follows, with 5 units of Botox  injected at each site:  -Procerus muscle, midline injection  -Corrugator muscle, bilateral injection  -Frontalis muscle, bilateral injection, with 2 sites each side, medial injection was  performed in the upper one third of the frontalis muscle, in the region vertical from the medial inferior edge of the superior orbital rim. The lateral injection was again in the upper one third of the forehead vertically above the lateral limbus of the cornea, 1.5 cm lateral to the medial injection site.  -Temporalis muscle injection, 4 sites, bilaterally. The first injection was 3 cm above the tragus of the ear, second injection site was 1.5 cm to 3 cm up from the first injection site in line with the tragus of the ear. The third injection site was 1.5-3 cm forward between the first 2 injection sites. The fourth injection site was 1.5 cm posterior to the second injection site. 5th site laterally in the temporalis  muscleat the level of the outer canthus.  -Occipitalis muscle injection, 3 sites, bilaterally. The first injection was done one half way between the occipital protuberance and the tip of the mastoid process behind the ear. The second injection site was done lateral and superior to the first, 1 fingerbreadth from the first injection. The third injection site was 1 fingerbreadth superiorly and medially from the first injection site.  -Cervical paraspinal muscle injection, 2 sites, bilaterally. The first injection site was 1 cm from the midline of the cervical spine, 3 cm inferior to the lower border of the occipital protuberance. The second injection site was 1.5 cm superiorly and laterally to the first injection site.  -Trapezius muscle injection was performed at 3 sites, bilaterally. The first injection site was in the upper trapezius muscle halfway between the inflection point of the neck, and the acromion. The second injection site was one half way between the acromion and the first injection site. The third injection was done between the first injection site and the inflection point of the neck.   Will return for repeat injection in 3 months.   A total of 200 units of Botox  was prepared,  155 units of Botox  was injected as documented above, any Botox  not injected was wasted. The patient tolerated the procedure well, there were no complications of the above procedure.

## 2024-07-21 ENCOUNTER — Encounter

## 2024-07-22 ENCOUNTER — Encounter: Payer: Self-pay | Admitting: Family Medicine

## 2024-07-22 ENCOUNTER — Ambulatory Visit: Admitting: Family Medicine

## 2024-07-22 VITALS — BP 112/68 | HR 71

## 2024-07-22 DIAGNOSIS — G43711 Chronic migraine without aura, intractable, with status migrainosus: Secondary | ICD-10-CM | POA: Diagnosis not present

## 2024-07-22 MED ORDER — ONABOTULINUMTOXINA 200 UNITS IJ SOLR
155.0000 [IU] | Freq: Once | INTRAMUSCULAR | Status: AC
  Administered 2024-07-22: 155 [IU] via INTRAMUSCULAR

## 2024-07-22 NOTE — Progress Notes (Signed)
 Botox -200U x 1vial Lot: [id number] Expiration: 09/2026 NDC: 9976-6078-97  Bacteriostatic 0.9% Sodium Chloride- 4mL total Lot: [id number] Expiration: 10/25/2025 NDC: 9590-8033-97  Specialty pharmacy  Witnessed ab:[id number] W,CMA  Dx: [phone]

## 2024-07-23 ENCOUNTER — Ambulatory Visit (INDEPENDENT_AMBULATORY_CARE_PROVIDER_SITE_OTHER)

## 2024-07-23 DIAGNOSIS — Z820 Family history of epilepsy and other diseases of the nervous system: Secondary | ICD-10-CM

## 2024-07-23 DIAGNOSIS — G4700 Insomnia, unspecified: Secondary | ICD-10-CM

## 2024-07-23 DIAGNOSIS — E663 Overweight: Secondary | ICD-10-CM

## 2024-07-23 DIAGNOSIS — G4733 Obstructive sleep apnea (adult) (pediatric): Secondary | ICD-10-CM | POA: Diagnosis not present

## 2024-07-23 DIAGNOSIS — R0689 Other abnormalities of breathing: Secondary | ICD-10-CM

## 2024-07-23 DIAGNOSIS — G4719 Other hypersomnia: Secondary | ICD-10-CM

## 2024-07-23 DIAGNOSIS — R519 Headache, unspecified: Secondary | ICD-10-CM

## 2024-07-23 DIAGNOSIS — Z9189 Other specified personal risk factors, not elsewhere classified: Secondary | ICD-10-CM

## 2024-07-23 DIAGNOSIS — R0683 Snoring: Secondary | ICD-10-CM

## 2024-07-23 DIAGNOSIS — R351 Nocturia: Secondary | ICD-10-CM

## 2024-07-23 DIAGNOSIS — G4720 Circadian rhythm sleep disorder, unspecified type: Secondary | ICD-10-CM

## 2024-07-24 ENCOUNTER — Ambulatory Visit: Payer: Self-pay | Admitting: Neurology

## 2024-07-24 DIAGNOSIS — G4733 Obstructive sleep apnea (adult) (pediatric): Secondary | ICD-10-CM

## 2024-07-24 NOTE — Procedures (Signed)
 See under procedure tab.

## 2024-07-31 NOTE — Telephone Encounter (Signed)
-----   Message from True Mar sent at 07/24/2024  4:48 PM EDT ----- Patient referred by Dr. Ines, seen by me on 06/18/24, diagnostic PSG on 07/22/24.    Please call and notify the patient that the recent sleep study did confirm the diagnosis of obstructive sleep apnea. OSA is overall mild, but worth treating to see if she feels better after  treatment. To that end I recommend treatment for this in the form of autoPAP, which means, that we don't have to bring her back for a second sleep study with CPAP, but will let him try an autoPAP  machine at home, through a DME company (of her choice, or as per insurance requirement). The DME representative will educate her on how to use the machine, how to put the mask on, etc. I have placed  an order in the chart. Please send referral, talk to patient, send report to referring MD. We will need a FU in sleep clinic for 10 weeks post-PAP set up, please arrange that with me or one of our  NPs. Thanks,   True Mar, MD, PhD [facility])   ----- Message ----- From: Rebecka Fleeta Higashi In One Three One Sent: 07/24/2024   4:38 PM EDT To: True Mar, MD

## 2024-07-31 NOTE — Telephone Encounter (Signed)
 Called pt and LVM (ok per DPR) asking for call back to discuss sleep study results. Alternatively, if patient prefers to send us  mychart message, she can and we can go over the results through there. Left office number for call back.

## 2024-08-02 ENCOUNTER — Other Ambulatory Visit: Payer: Self-pay | Admitting: Neurology

## 2024-08-06 ENCOUNTER — Telehealth: Payer: Self-pay | Admitting: Neurology

## 2024-08-06 ENCOUNTER — Encounter: Payer: Self-pay | Admitting: Neurology

## 2024-08-06 NOTE — Telephone Encounter (Signed)
 Pt  called to request order be put in for Infusions today , Pt states she has been having really bad migraines since last Wednesday and the pain has gotten worst . Pt is requesting to come in today if possible because PT is off work today .

## 2024-08-06 NOTE — Telephone Encounter (Signed)
-----   Message from True Mar sent at 07/24/2024  4:48 PM EDT ----- Patient referred by Dr. Ines, seen by me on 06/18/24, diagnostic PSG on 07/22/24.    Please call and notify the patient that the recent sleep study did confirm the diagnosis of obstructive sleep apnea. OSA is overall mild, but worth treating to see if she feels better after  treatment. To that end I recommend treatment for this in the form of autoPAP, which means, that we don't have to bring her back for a second sleep study with CPAP, but will let him try an autoPAP  machine at home, through a DME company (of her choice, or as per insurance requirement). The DME representative will educate her on how to use the machine, how to put the mask on, etc. I have placed  an order in the chart. Please send referral, talk to patient, send report to referring MD. We will need a FU in sleep clinic for 10 weeks post-PAP set up, please arrange that with me or one of our  NPs. Thanks,   True Mar, MD, PhD [facility])   ----- Message ----- From: Rebecka Fleeta Higashi In One Three One Sent: 07/24/2024   4:38 PM EDT To: True Mar, MD

## 2024-08-06 NOTE — Telephone Encounter (Signed)
 Sent mychart message with results to pt.  She will get back to us  re: her results once read.

## 2024-08-06 NOTE — Telephone Encounter (Addendum)
 Bad migraine since last Wednesday and pain has gotten worse. She is level 10+.  Pt is off today and asking for infusion. I called pt.  She has had since last Wednesday.  Off today from work.  Nausea, has tried her acute medications and have not been able to break cycle.  Pt had last infusion for migraine cocktail 03-15-2024. (Depacon 1 gm IV, 125mg  solumedrol, zofran  4mg  po, toradol  30mg ) which pt states help to break cycle.  Availability for migraine cocktail for pt at 1300 or 1400 per Briana in [facility].   She has 08-20-2024 Mychart [facility] med management appt. Consulted Dr. Ines.  Ok to come in for infusion.  I spoke to pt and relayed that we will not be doing infusions for acute migraine treatments (will need to see urgent care/pcp). Pt verbalized understanding.

## 2024-08-07 ENCOUNTER — Other Ambulatory Visit: Payer: Self-pay | Admitting: *Deleted

## 2024-08-07 MED ORDER — RIZATRIPTAN BENZOATE 10 MG PO TBDP: 10.0000 mg | ORAL_TABLET | ORAL | 11 refills | Status: AC | PRN

## 2024-08-07 NOTE — Telephone Encounter (Signed)
 RE: new autopap user Received: Today Zott, Glade Salt, Nena RAMAN, RN; Lavon Milling; Darrel Boyer Got It Thank you     Previous Messages    ----- Message ----- From: Salt Nena RAMAN, RN Sent: 08/07/2024  11:48 AM EDT To: Milling Donnice Boyer Darrel; Glade Zott Subject: new autopap user                              New order in epic   for pt. (New autopap user)  Hayley Hamilton Female, 48 y.o., 1976/07/10 MRN: [id number]   [name]

## 2024-08-20 ENCOUNTER — Telehealth: Admitting: Neurology

## 2024-08-20 ENCOUNTER — Telehealth: Payer: Self-pay | Admitting: Neurology

## 2024-08-20 ENCOUNTER — Other Ambulatory Visit: Payer: Self-pay | Admitting: Neurology

## 2024-08-20 ENCOUNTER — Encounter: Payer: Self-pay | Admitting: Neurology

## 2024-08-20 DIAGNOSIS — G43711 Chronic migraine without aura, intractable, with status migrainosus: Secondary | ICD-10-CM

## 2024-08-20 DIAGNOSIS — M7918 Myalgia, other site: Secondary | ICD-10-CM | POA: Diagnosis not present

## 2024-08-20 DIAGNOSIS — Z9889 Other specified postprocedural states: Secondary | ICD-10-CM

## 2024-08-20 DIAGNOSIS — M542 Cervicalgia: Secondary | ICD-10-CM | POA: Diagnosis not present

## 2024-08-20 DIAGNOSIS — G8928 Other chronic postprocedural pain: Secondary | ICD-10-CM | POA: Diagnosis not present

## 2024-08-20 MED ORDER — QULIPTA 60 MG PO TABS: 60.0000 mg | ORAL_TABLET | Freq: Every day | ORAL | 11 refills | Status: AC

## 2024-08-20 MED ORDER — CYCLOBENZAPRINE HCL 10 MG PO TABS: 10.0000 mg | ORAL_TABLET | Freq: Every evening | ORAL | 3 refills | Status: AC | PRN

## 2024-08-20 NOTE — Telephone Encounter (Signed)
 Followup 6 months video dr ines please

## 2024-08-20 NOTE — Telephone Encounter (Signed)
 Referral for physical therapy fax to [facility]. Phone: [phone], Fax: [phone]

## 2024-08-20 NOTE — Telephone Encounter (Signed)
 Spoke with patient and scheduled VV with Dr Ines for 02/24/25 at [facility]

## 2024-08-20 NOTE — Patient Instructions (Addendum)
 Start Qulipta : [email or web address]-program  Other options in addition to the botox  includes Aimovig and Vyepti but atthis time start Qulipta   Flexeril  at bedtime  Dry needling  Cyclobenzaprine  Tablets What is this medication? CYCLOBENZAPRINE  (sye kloe BEN za preen) treats muscle spasms. It works by relaxing your muscles, which reduces muscle stiffness. It belongs to a group of medications called muscle relaxants. This medicine may be used for other purposes; ask your health care provider or pharmacist if you have questions. COMMON BRAND NAME(S): Fexmid , Flexeril  What should I tell my care team before I take this medication? They need to know if you have any of these conditions: Heart disease, irregular heartbeat, or previous heart attack Liver disease Thyroid problem An unusual or allergic reaction to cyclobenzaprine , tricyclic antidepressants, lactose, other medications, foods, dyes, or preservatives Pregnant or trying to get pregnant Breastfeeding How should I use this medication? Take this medication by mouth with a glass of water. Take it as directed on the prescription label. You can take it with or without food. If it upsets your stomach, take it with food. Do not take it more often than directed. Talk to your care team about the use of this medication in children. Special care may be needed. Overdosage: If you think you have taken too much of this medicine contact a poison control center or emergency room at once. NOTE: This medicine is only for you. Do not share this medicine with others. What if I miss a dose? If you miss a dose, take it as soon as you can. If it is almost time for your next dose, take only that dose. Do not take double or extra doses. What may interact with this medication? Do not take this medication with any of the following: MAOIs, such as Carbex, Eldepryl, Marplan, Nardil, and Parnate Opioid medications for  cough Safinamide This medication may also interact with the following: Alcohol Bupropion Antihistamines for allergy, cough, and cold Certain medications for anxiety or sleep Certain medications for bladder problems, such as oxybutynin, tolterodine Certain medications for depression, such as amitriptyline, fluoxetine, sertraline Certain medications for Parkinson disease, such as benztropine, trihexyphenidyl Certain medications for seizures, such as phenobarbital, primidone Certain medications for stomach problems, such as dicyclomine, hyoscyamine Certain medications for travel sickness, such as scopolamine General anesthetics, such as halothane, isoflurane, methoxyflurane, propofol Ipratropium Local anesthetics, such as lidocaine, pramoxine, tetracaine Medications that relax muscles for surgery Opioid medications for pain Phenothiazines, such as chlorpromazine, mesoridazine, prochlorperazine, thioridazine Verapamil This list may not describe all possible interactions. Give your health care provider a list of all the medicines, herbs, non-prescription drugs, or dietary supplements you use. Also tell them if you smoke, drink alcohol, or use illegal drugs. Some items may interact with your medicine. What should I watch for while using this medication? Visit your care team for regular checks on your progress. Tell your care team if your symptoms do not start to get better or if they get worse. This medication may affect your coordination, reaction time, or judgment. Do not drive or operate machinery until you know how this medication affects you. Sit up or stand slowly to reduce the risk of dizzy or fainting spells. Drinking alcohol with this medication can increase the risk of these side effects. Taking this medication with other substances that cause drowsiness, such as alcohol, benzodiazepines, or other opioids can cause serious side effects. Give your care team a list of all medications you  use. They will tell you how much medication to  take. Do not take more medication than directed. Call emergency services if you have problems breathing or staying awake. Your mouth may get dry. Chewing sugarless gum or sucking hard candy and drinking plenty of water may help. Contact your care team if the problem does not go away or is severe. What side effects may I notice from receiving this medication? Side effects that you should report to your care team as soon as possible: Allergic reactions--skin rash, itching, hives, swelling of the face, lips, tongue, or throat CNS depression--slow or shallow breathing, shortness of breath, feeling faint, dizziness, confusion, trouble staying awake Heart rhythm changes--fast or irregular heartbeat, dizziness, feeling faint or lightheaded, chest pain, trouble breathing Side effects that usually do not require medical attention (report to your care team if they continue or are bothersome): Constipation Dizziness Drowsiness Dry mouth Fatigue Nausea This list may not describe all possible side effects. Call your doctor for medical advice about side effects. You may report side effects to FDA at 1-800-FDA-1088. Where should I keep my medication? Keep out of the reach of children and pets. Store at room temperature between 20 and 25 degrees C (68 and 77 degrees F). Keep container tightly closed. Get rid of any unused medication after the expiration date. To get rid of medications that are no longer needed or have expired: Take the medication to a medication take-back program. Check with your pharmacy or law enforcement to find a location. If you cannot return the medication, check the label or package insert to see if the medication should be thrown out in the garbage or flushed down the toilet. If you are not sure, ask your care team. If it is safe to put it in the trash, empty the medication out of the container. Mix the medication with cat litter, dirt,  coffee grounds, or other unwanted substance. Seal the mixture in a bag or container. Put it in the trash. NOTE: This sheet is a summary. It may not cover all possible information. If you have questions about this medicine, talk to your doctor, pharmacist, or health care provider.  2024 Elsevier/Gold Standard (2022-06-17 00:00:00) Atogepant  Tablets What is this medication? ATOGEPANT  (a TOE je pant) prevents migraines. It works by blocking a substance in the body that causes migraines. This medicine may be used for other purposes; ask your health care provider or pharmacist if you have questions. COMMON BRAND NAME(S): QULIPTA  What should I tell my care team before I take this medication? They need to know if you have any of these conditions: Circulation problems in fingers or toes (Raynaud syndrome) High blood pressure Kidney disease Liver disease An unusual or allergic reaction to atogepant , other medications, foods, dyes, or preservatives Pregnant or trying to get pregnant Breastfeeding How should I use this medication? Take this medication by mouth with water. Take it as directed on the prescription label at the same time every day. You can take it with or without food. If it upsets your stomach, take it with food. Keep taking it unless your care team tells you to stop. Talk to your care team about the use of this medication in children. Special care may be needed. Overdosage: If you think you have taken too much of this medicine contact a poison control center or emergency room at once. NOTE: This medicine is only for you. Do not share this medicine with others. What if I miss a dose? If you miss a dose, take it as soon as you can. If  it is almost time for your next dose, take only that dose. Do not take double or extra doses. What may interact with this medication? Carbamazepine Certain medications for fungal infections, such as itraconazole,  ketoconazole Clarithromycin Cyclosporine Efavirenz Etravirine Phenytoin Rifampin St. John's wort This list may not describe all possible interactions. Give your health care provider a list of all the medicines, herbs, non-prescription drugs, or dietary supplements you use. Also tell them if you smoke, drink alcohol, or use illegal drugs. Some items may interact with your medicine. What should I watch for while using this medication? Visit your care team for regular checks on your progress. Tell your care team if your symptoms do not start to get better or if they get worse. What side effects may I notice from receiving this medication? Side effects that you should report to your care team as soon as possible: Allergic reactions or angioedema--skin rash, itching or hives, swelling of the face, eyes, lips, tongue, arms, or legs, trouble swallowing or breathing Increase in blood pressure Raynaud syndrome--cool, numb, or painful fingers or toes that may change color from pale, to blue, to red Side effects that usually do not require medical attention (report these to your care team if they continue or are bothersome): Constipation Fatigue Loss of appetite Nausea This list may not describe all possible side effects. Call your doctor for medical advice about side effects. You may report side effects to FDA at 1-800-FDA-1088. Where should I keep my medication? Keep out of the reach of children and pets. Store at room temperature between 20 and 25 degrees C (68 and 77 degrees F). Get rid of any unused medication after the expiration date. To get rid of medications that are no longer needed or have expired: Take the medication to a medication take-back program. Check with your pharmacy or law enforcement to find a location. If you cannot return the medication, check the label or package insert to see if the medication should be thrown out in the garbage or flushed down the toilet. If you are not  sure, ask your care team. If it is safe to put it in the trash, take the medication out of the container. Mix the medication with cat litter, dirt, coffee grounds, or other unwanted substance. Seal the mixture in a bag or container. Put it in the trash. NOTE: This sheet is a summary. It may not cover all possible information. If you have questions about this medicine, talk to your doctor, pharmacist, or health care provider.  2025 Elsevier/Gold Standard (2024-03-21 00:00:00)

## 2024-08-20 NOTE — Progress Notes (Signed)
 [facility]    Provider:  Dr Ines Requesting Provider: [facility] Primary Care Provider:  Vernon Velna SAUNDERS, MD  CC:  Chronic migraines  Could not connect via virtual video (patient's audio and camera not working)  Virtual Visit via Telephone Note  I connected with [name] on 08/20/24 at  9:30 AM EDT by telephone and verified that I am speaking with the correct person using two identifiers.  Location: Patient: Hayley [place] Provider: Provider's office   I discussed the limitations, risks, security and privacy concerns of performing an evaluation and management service by telephone and the availability of in person appointments. I also discussed with the patient that there may be a patient responsible charge related to this service. The patient expressed understanding and agreed to proceed.   History of Present Illness:     I discussed the assessment and treatment plan with the patient. The patient was provided an opportunity to ask questions and all were answered. The patient agreed with the plan and demonstrated an understanding of the instructions.   The patient was advised to call back or seek an in-person evaluation if the symptoms worsen or if the condition fails to improve as anticipated.  I provided 30 minutes of non-face-to-face time during this encounter.   [name], MD  08/20/2024: She did extremely well after botox  but has been worsening the last month. She has not been taking the injection. Hasn't taken emgality  since march. The botox  helps improve migraine frequency and severity by > 50% but still having 8 migraine days a month and >15 total headache days a month. Tried Ajovy , tried emgality  will layer on Qulipta . She had a sleep study and overall mild and getting Hayley cpap (see below).   08/07/2024: new autopap user Reviewed message Dr. Buck: 07/31/2024: Please call and notify the patient that the recent sleep  study did confirm the diagnosis of obstructive sleep apnea. OSA is overall mild, but worth treating to see if she feels better after treatment. To that end I recommend treatment for this in the form of autoPAP, which means, that we don't have to bring Hayley back for a second sleep study with CPAP, but will let him try an autoPAP machine at home, through a DME company (of Hayley choice, or as per insurance requirement). The DME representative will educate Hayley on how to use the machine, how to put the mask on  From a thorough review of records, medications tried greater than 3 months include: Tried topiramate (side effects could not tolerate), BP medications contraindicated due to hypotension tried propranolol and had hypotension, gabapentin, lyrica, robaxin, naproxen  lexapro,tried amitriptyline and nortriptyline in the past and now  cannot take amirtiptyline or nortriptyline because she is currently on another seratonergic agent lexapro and combination can cause seratonin syndrome, sumatriptan, rizatriptan , zonisamide, Emgality , botox , ajovy   Patient complains of symptoms per HPI as well as the following symptoms: neck pain . Pertinent negatives and positives per HPI. All others negative   8/12/225:   Bad migraine since last Wednesday and pain has gotten worse. She is level 10+.  Pt is off today and asking for infusion. I called pt.  She has had since last Wednesday.  Off today from [place].  Nausea, has tried Hayley acute medications and have not been able to break cycle.  Pt had last infusion for migraine cocktail 03-15-2024. (Depacon 1 gm IV, 125mg  solumedrol, zofran  4mg  po, toradol  30mg ) which pt  states help to break cycle.  Availability for migraine cocktail for pt at 1300 or 1400 per Briana in [facility].   She has 08-20-2024 Mychart [facility] med management appt. Consulted Dr. Ines.  Ok to come in for infusion.  I spoke to pt and relayed that we will not be doing infusions for acute migraine treatments (will need to see  urgent care/pcp). Pt verbalized understanding.    07/22/24 ALL: Aishi returns for Botox . She reports migraines have been much better. She has not had a migraine in the past 6 weeks. She continues Emgality , Nurtec and or rizatriptan .   She was seen in consult with Dr Buck for concerns of possible sleep apnea. Sleep study ordered and being performed tomorrow night. Mother has sleep apnea.   04/29/2024 ALL: [name] returns for second Botox  procedure. First procedure performed with Dr Ines 01/2024. She reports significant improvement in intensity and frequency of migraines for about 6 weeks but then headache returned. She is having near daily headaches. She continues Emgality , Nurtec and or rizatriptan .   01/2024: first botox   08/08/2023: Still having 23-30 total headace days a month and >10 moderate to severe migraine days a month lasting 4-24 hours for > 6 months tried and failed multiple medications and all the cgrp injections, no aura, no medication oversue, pulsating pounding throbbing, can be moderate to severe, affecting the quality of Hayley life and affecting Hayley [place], it can be unilateral and spread bilaterally, nausea, photophobia, phonophobia, osmophobia, hurts to move, can last upwards of 24 hours a day, ongoing since 2023 and debilitating, most days she has a constant low-grade headache, on top of that moderate to severe migraines usually behind the eyes, smells can make it worse, she also has vomiting, it hurts to move, she is in a dark room (today she is in a dark room in my office).   MRi brain: IMPRESSION: reviewed images and labwork  Normal MRI brain (with and without).  MRI cervical spine: 04/03/2023: IMPRESSION: 1. Interval C5-C6 ACDF without residual stenosis. 2. Unchanged mild multilevel cervical spondylosis as described above without high-grade stenosis or impingement.   Patient complains of symptoms per HPI as well as the following symptoms: none . Pertinent negatives and positives  per HPI. All others negative       Latest Ref Rng & Units 12/28/2023    4:56 PM 08/01/2023    2:32 PM 08/17/2010    5:49 AM  CBC  WBC 4.0 - 10.5 K/uL 10.5  7.5  8.3   Hemoglobin 12.0 - 15.0 g/dL 85.7  85.7  89.8   Hematocrit 36.0 - 46.0 % 42.8  42.9  28.9   Platelets 150 - 400 K/uL 229  241  236       Latest Ref Rng & Units 12/28/2023    6:40 PM 08/01/2023    2:32 PM 03/31/2008    7:02 AM  CMP  Glucose 70 - 99 mg/dL 89  87  95   BUN 6 - 20 mg/dL 5  5  12    Creatinine 0.44 - 1.00 mg/dL 9.24  9.26  9.38   Sodium 135 - 145 mmol/L 131  139  140   Potassium 3.5 - 5.1 mmol/L 3.8  4.3  3.9   Chloride 98 - 111 mmol/L 95  100  105   CO2 22 - 32 mmol/L 25  25  27    Calcium 8.9 - 10.3 mg/dL 9.3  9.3  9.6   Total Protein 6.5 - 8.1 g/dL 6.5  6.9    Total Bilirubin 0.0 - 1.2 mg/dL 0.9  <9.7    Alkaline Phos 38 - 126 U/L 58  69    AST 15 - 41 U/L 21  14    ALT 0 - 44 U/L 19  13       HPI:  Hayley [place] is a 48 y.o. female here as requested by [facility] for migraines. has ABDOMINAL PAIN, LEFT LOWER QUADRANT; DOE (dyspnea on exertion); Orthostatic hypotension; Right ankle injury; Chronic migraine without aura, with intractable migraine, so stated, with status migrainosus; Cervical myofascial pain syndrome; and Chronic neck pain with history of cervical spinal surgery on their problem list.  2 years ago she had neck pain, went to spine and had acdf. Started having headaches then starte to be migraines in 2023. Debilitating. She has a constant low grade pain. On top of that become moderate to severe, pulsating/pounding/throbbing/constant, usually behind the eyes, photo/phonophobia, nausea, smells can make worse or trigger, vomiting, hurts to move, leayes in a dark room. Motion sensitivity. Had acdf but still myofascial muscle cervical pain remains, she went back to [place] Nov 2023, daily headaches and daily myofascial neck pain. Tried dry needling, went to PT, ice, heating, failed  conservative measures. Daily headaches. She misses [place] often. 16 moderate to severe migraine days a month, last up to 72 hours, sleep helps but wakes up with it, she has blurry vision and vision changes but 3 vision to optho and nothing found, she has impaired coordination, imbalance, difficulty communication, aphasia getting words out, cognitive deficits, fatigue, morning headaches. Worse in the morning when waking. No snoring. Wakes with dry mouth. Wakes often. She never wake up refreshed. Never had sleep apnea testing, mother has sleep apnea, she has gained 50+ pounds.Noaura, no medication overuse, ongoing at this freq and severity for > 1 year, affecting life and [place]. Also neck pain. No other focal neurologic deficits, associated symptoms, inciting events or modifiable factors.   Reviewed notes, labs and imaging from outside physicians, which showed:   MRI cervical spine 03/2023: FINDINGS: Alignment: Chronic straightening of the normal cervical lordosis. No significant listhesis.   Vertebrae: No fracture, evidence of discitis, or bone lesion. Prior C5-C6 ACDF.   Cord: Normal signal and morphology.   Posterior Fossa, vertebral arteries, paraspinal tissues: Negative.   Disc levels:   C2-C3: Negative disc. Mild bilateral facet arthropathy. No stenosis.   C3-C4: Unchanged minimal disc bulging with moderate right and mild left facet arthropathy. No stenosis.   C4-C5: Unchanged mild disc bulging and bilateral facet arthropathy. No stenosis.   C5-C6: Interval ACDF. Moderate right facet arthropathy. No residual stenosis.   C6-C7: Unchanged mild disc bulging and moderate bilateral facet arthropathy. Unchanged borderline mild left neuroforaminal stenosis. No spinal canal or right neuroforaminal stenosis.   C7-T1: Negative disc. Unchanged mild to moderate left facet arthropathy. No stenosis.   IMPRESSION: 1. Interval C5-C6 ACDF without residual stenosis. 2. Unchanged mild multilevel  cervical spondylosis as described above without high-grade stenosis or impingement.  Review of Systems: Patient complains of symptoms per HPI as well as the following symptoms migraine. Pertinent negatives and positives per HPI. All others negative.   Social History   Socioeconomic History   Marital status: Married    Spouse name: Not on file   Number of children: Not on file   Years of education: Not on file   Highest education level: Not on file  Occupational History   Not on file  Tobacco Use   Smoking status: Former   Smokeless tobacco: Never   Tobacco comments:    quit 2006-2007  Vaping Use   Vaping status: Never Used  Substance and Sexual Activity   Alcohol use: Yes    Alcohol/week: 1.0 - 2.0 standard drink of alcohol    Types: 1 - 2 Standard drinks or equivalent per week    Comment: social   Drug use: No   Sexual activity: Not on file  Other Topics Concern   Not on file  Social History Narrative   Caffeine: 2 cups coffee in the morning   Right handed   Pt lives with family    Pt works    Social Drivers of Corporate investment banker Strain: Not on file  Food Insecurity: Not on file  Transportation Needs: Not on file  Physical Activity: Not on file  Stress: Not on file  Social Connections: Not on file  Intimate Partner Violence: Not on file    Family History  Problem Relation Age of Onset   Sleep apnea Mother    Colon polyps Father    Colon cancer Paternal Uncle    Colon polyps Paternal Uncle    Diabetes Maternal Grandmother    Diabetes Maternal Grandfather    Diabetes Paternal Grandmother    Irritable bowel syndrome Paternal Grandmother    Colon cancer Paternal Grandfather    Colon polyps Paternal Grandfather    Diabetes Paternal Grandfather    Heart disease Paternal Grandfather    Migraines Neg Hx     Past Medical History:  Diagnosis Date   Breast mass 05/02/2017   bilat breast masses and lt axilla   Migraine     Patient Active Problem  List   Diagnosis Date Noted   Cervical myofascial pain syndrome 08/20/2024   Chronic neck pain with history of cervical spinal surgery 08/20/2024   Chronic migraine without aura, with intractable migraine, so stated, with status migrainosus 12/07/2023   Right ankle injury 04/29/2015   DOE (dyspnea on exertion) 12/03/2013   Orthostatic hypotension 12/03/2013   ABDOMINAL PAIN, LEFT LOWER QUADRANT 07/03/2008    Past Surgical History:  Procedure Laterality Date   ABDOMINAL HYSTERECTOMY     ACDF  09/2022   APPENDECTOMY  2003   BREAST EXCISIONAL BIOPSY Right    COLONOSCOPY      Current Outpatient Medications  Medication Sig Dispense Refill   Atogepant  (QULIPTA ) 60 MG TABS Take 1 tablet (60 mg total) by mouth daily. 30 tablet 11   cyclobenzaprine  (FLEXERIL ) 10 MG tablet Take 1 tablet (10 mg total) by mouth at bedtime as needed for muscle spasms. 90 tablet 3   botulinum toxin Type A  (BOTOX ) 200 units injection Provider to inject 155 units into the muscles of the head and neck every 12 weeks. Discard remainder. 1 each 3   escitalopram (LEXAPRO) 20 MG tablet Take 20 mg by mouth daily.     LORazepam (ATIVAN) 0.5 MG tablet Take 0.5 mg by mouth every 8 (eight) hours as needed for anxiety.     ondansetron  (ZOFRAN -ODT) 4 MG disintegrating tablet DISSOLVE 1 TO 2 TABLETS(4 TO 8 MG) ON THE TONGUE EVERY 8 HOURS AS NEEDED 30 tablet 3   Rimegepant Sulfate (NURTEC) 75 MG TBDP Take 1 tablet (75 mg total) by mouth every other day. For migraines. Take as close to onset of migraine as possible. One daily maximum. 16 tablet 0   rizatriptan  (MAXALT -MLT) 10 MG disintegrating tablet Take 1 tablet (10  mg total) by mouth as needed for migraine (max 2 tabs in 24 hrs. (2-3 times a week)). May repeat in 2 hours if needed 9 tablet 11   zolpidem (AMBIEN) 10 MG tablet   5   No current facility-administered medications for this visit.    Allergies as of 08/20/2024 - Review Complete 07/22/2024  Allergen Reaction Noted    Codeine      Vitals: LMP 08/20/2012  Last Weight:  Wt Readings from Last 1 Encounters:  06/18/24 183 lb (83 kg)   Last Height:   Ht Readings from Last 1 Encounters:  06/18/24 5' 6 (1.676 m)   Physical exam: Exam:  Speech:    Speech is normal; fluent and spontaneous with normal comprehension.  Cognition:    The patient is oriented to person, place, and time;     recent and remote memory intact;     language fluent;     normal attention, concentration, fund of knowledge       Assessment/Plan:  Patient with chronic migraines. Discussed options today. MRI brain normal  The botox  helps improve migraine frequency and severity by > 50% but still having 8 migraine days a month and >15 total headache days a month. Tried Ajovy , tried emgality  will layer on Qulipta . She had a sleep study and overall mild and getting Hayley cpap (see below).   Migraine prevention: Continue botox , start qulipta ,  Migraine acute: continue maxalt , continue nurtec, continue zofran , Cervical myofascial pain: flexeril  at bedtime  Cervical myofascial pain syndrome: Muscle relaxer in the past has helped, also dry needling will prescribe both, Please send to [facility] for dry needling cervical myofascial pain  Other options include Aimovig, Vyepti every 3 month infusion, nurtec every other day  She had a sleep study and overall mild and getting Hayley cpap  From a thorough review of records, medications tried greater than 3 months include: Tried topiramate (side effects could not tolerate), BP medications contraindicated due to hypotension tried propranolol and had hypotension, gabapentin, lyrica, robaxin, naproxen  lexapro,tried amitriptyline and nortriptyline in the past and now  cannot take amirtiptyline or nortriptyline because she is currently on another seratonergic agent lexapro and combination can cause seratonin syndrome, sumatriptan, rizatriptan , zonisamide, Emgality , botox , ajovy     Orders Placed  This Encounter  Procedures   Ambulatory referral to Physical Therapy   Meds ordered this encounter  Medications   Atogepant  (QULIPTA ) 60 MG TABS    Sig: Take 1 tablet (60 mg total) by mouth daily.    Dispense:  30 tablet    Refill:  11   cyclobenzaprine  (FLEXERIL ) 10 MG tablet    Sig: Take 1 tablet (10 mg total) by mouth at bedtime as needed for muscle spasms.    Dispense:  90 tablet    Refill:  3    Cc: Pa, [facility]  Pahwani, Velna SAUNDERS, MD  [name] Epp, MD  [facility] [address] [place], [place] [address]  Phone [phone] Fax [phone]

## 2024-09-25 ENCOUNTER — Encounter: Payer: Self-pay | Admitting: Neurology

## 2024-09-25 NOTE — Telephone Encounter (Signed)
 Setup was 08/23/24 (appt needed 9/30-11/29). Can be with Amy or Dr Buck. Just make sure appt does not fall within 10 days after her next botox  appointment.

## 2024-09-30 ENCOUNTER — Other Ambulatory Visit: Payer: Self-pay

## 2024-10-03 ENCOUNTER — Telehealth: Payer: Self-pay

## 2024-10-03 NOTE — Telephone Encounter (Signed)
 Called and left voicemail for patient to reschedule appointment on 02/24/25 with Dr Ines.  If patient calls back, they can be rescheduled with Dr Buck or Dr Onita

## 2024-10-09 ENCOUNTER — Other Ambulatory Visit: Payer: Self-pay

## 2024-10-09 NOTE — Progress Notes (Signed)
 Specialty Pharmacy Refill Coordination Note  Hayley Hamilton is a 48 y.o. female assessed today regarding refills of clinic administered specialty medication(s) OnabotulinumtoxinA  (BOTOX )   Clinic requested Courier to Provider Office   Delivery date: 10/10/24   Verified address: [facility] [address] [place], [place] [address]   Medication will be filled on 10/09/24.

## 2024-10-16 ENCOUNTER — Ambulatory Visit: Admitting: Family Medicine

## 2024-10-21 NOTE — Progress Notes (Unsigned)
 10/21/24 ALL: Hayley [place] returns for Botox . She reports doing well. Migraines have worsened over the past few weeks d/t being late getting Botox . She recently started CPAP. Dr ines switched Emgality  to Qulipta  07/2024. She continues Nurtec and or rizatriptan  for abortive therapy.   07/22/2024 ALL: Hayley [place] returns for Botox . She reports migraines have been much better. She has not had a migraine in the past 6 weeks. She continues Emgality , Nurtec and or rizatriptan .   She was seen in consult with Dr Buck for concerns of possible sleep apnea. Sleep study ordered and being performed tomorrow night. Mother has sleep apnea.   04/29/2024 ALL: Hayley [place] returns for second Botox  procedure. First procedure performed with Dr Ines 01/2024. She reports significant improvement in intensity and frequency of migraines for about 6 weeks but then headache returned. She is having near daily headaches. She continues Emgality , Nurtec and or rizatriptan .     Consent Form Botulism Toxin Injection For Chronic Migraine    Reviewed orally with patient, additionally signature is on file:  Botulism toxin has been approved by the Federal drug administration for treatment of chronic migraine. Botulism toxin does not cure chronic migraine and it may not be effective in some patients.  The administration of botulism toxin is accomplished by injecting a small amount of toxin into the muscles of the neck and head. Dosage must be titrated for each individual. Any benefits resulting from botulism toxin tend to wear off after 3 months with a repeat injection required if benefit is to be maintained. Injections are usually done every 3-4 months with maximum effect peak achieved by about 2 or 3 weeks. Botulism toxin is expensive and you should be sure of what costs you will incur resulting from the injection.  The side effects of botulism toxin use for chronic migraine may include:   -Transient, and usually mild, facial weakness with  facial injections  -Transient, and usually mild, head or neck weakness with head/neck injections  -Reduction or loss of forehead facial animation due to forehead muscle weakness  -Eyelid drooping  -Dry eye  -Pain at the site of injection or bruising at the site of injection  -Double vision  -Potential unknown long term risks   Contraindications: You should not have Botox  if you are pregnant, nursing, allergic to albumin, have an infection, skin condition, or muscle weakness at the site of the injection, or have myasthenia gravis, Lambert-Eaton syndrome, or ALS.  It is also possible that as with any injection, there may be an allergic reaction or no effect from the medication. Reduced effectiveness after repeated injections is sometimes seen and rarely infection at the injection site may occur. All care will be taken to prevent these side effects. If therapy is given over a long time, atrophy and wasting in the muscle injected may occur. Occasionally the patient's become refractory to treatment because they develop antibodies to the toxin. In this event, therapy needs to be modified.  I have read the above information and consent to the administration of botulism toxin.    BOTOX  PROCEDURE NOTE FOR MIGRAINE HEADACHE  Contraindications and precautions discussed with patient(above). Aseptic procedure was observed and patient tolerated procedure. Procedure performed by Greig Forbes, FNP-C.   The condition has existed for more than 6 months, and pt does not have a diagnosis of ALS, Myasthenia Gravis or Lambert-Eaton Syndrome.  Risks and benefits of injections discussed and pt agrees to proceed with the procedure.  Written consent obtained  These injections are medically  necessary. Pt  receives good benefits from these injections. These injections do not cause sedations or hallucinations which the oral therapies may cause.   Description of procedure:  The patient was placed in a sitting position.  The standard protocol was used for Botox  as follows, with 5 units of Botox  injected at each site:  -Procerus muscle, midline injection  -Corrugator muscle, bilateral injection  -Frontalis muscle, bilateral injection, with 2 sites each side, medial injection was performed in the upper one third of the frontalis muscle, in the region vertical from the medial inferior edge of the superior orbital rim. The lateral injection was again in the upper one third of the forehead vertically above the lateral limbus of the cornea, 1.5 cm lateral to the medial injection site.  -Temporalis muscle injection, 4 sites, bilaterally. The first injection was 3 cm above the tragus of the ear, second injection site was 1.5 cm to 3 cm up from the first injection site in line with the tragus of the ear. The third injection site was 1.5-3 cm forward between the first 2 injection sites. The fourth injection site was 1.5 cm posterior to the second injection site. 5th site laterally in the temporalis  muscleat the level of the outer canthus.  -Occipitalis muscle injection, 3 sites, bilaterally. The first injection was done one half way between the occipital protuberance and the tip of the mastoid process behind the ear. The second injection site was done lateral and superior to the first, 1 fingerbreadth from the first injection. The third injection site was 1 fingerbreadth superiorly and medially from the first injection site.  -Cervical paraspinal muscle injection, 2 sites, bilaterally. The first injection site was 1 cm from the midline of the cervical spine, 3 cm inferior to the lower border of the occipital protuberance. The second injection site was 1.5 cm superiorly and laterally to the first injection site.  -Trapezius muscle injection was performed at 3 sites, bilaterally. The first injection site was in the upper trapezius muscle halfway between the inflection point of the neck, and the acromion. The second injection site  was one half way between the acromion and the first injection site. The third injection was done between the first injection site and the inflection point of the neck.   Will return for repeat injection in 3 months.   A total of 200 units of Botox  was prepared, 155 units of Botox  was injected as documented above, any Botox  not injected was wasted. The patient tolerated the procedure well, there were no complications of the above procedure.

## 2024-10-22 ENCOUNTER — Encounter: Payer: Self-pay | Admitting: Neurology

## 2024-10-23 ENCOUNTER — Ambulatory Visit: Admitting: Family Medicine

## 2024-10-23 ENCOUNTER — Encounter: Payer: Self-pay | Admitting: Family Medicine

## 2024-10-23 VITALS — BP 115/72

## 2024-10-23 DIAGNOSIS — G43711 Chronic migraine without aura, intractable, with status migrainosus: Secondary | ICD-10-CM

## 2024-10-23 MED ORDER — ONABOTULINUMTOXINA 200 UNITS IJ SOLR
155.0000 [IU] | Freq: Once | INTRAMUSCULAR | Status: AC
  Administered 2024-10-23: 155 [IU] via INTRAMUSCULAR

## 2024-10-23 NOTE — Progress Notes (Signed)
 Botox - 200 units x 1 vial Lot: [id number] Expiration: 11/2026 NDC: 9976-6078-97  Bacteriostatic 0.9% Sodium Chloride- 30 mL  Lot: [id number] Expiration: OCT-31-2026 NDC: 9590-8033-97  Dx: G43.711 S/P Witnessed by: April Jones, RN

## 2024-10-28 ENCOUNTER — Encounter: Admitting: Neurology

## 2024-10-30 NOTE — Patient Instructions (Addendum)
 Please continue using your CPAP regularly. While your insurance requires that you use CPAP at least 4 hours each night on 70% of the nights, I recommend, that you not skip any nights and use it throughout the night if you can. Getting used to CPAP and staying with the treatment long term does take time and patience and discipline. Untreated obstructive sleep apnea when it is moderate to severe can have an adverse impact on cardiovascular health and raise her risk for heart disease, arrhythmias, hypertension, congestive heart failure, stroke and diabetes. Untreated obstructive sleep apnea causes sleep disruption, nonrestorative sleep, and sleep deprivation. This can have an impact on your day to day functioning and cause daytime sleepiness and impairment of cognitive function, memory loss, mood disturbance, and problems focussing. Using CPAP regularly can improve these symptoms.  Contact DME to discuss mask options. DME [facility] [id number]  We will continue Botox  every 12 weeks. I will try to get Vyepti covered. This will replace Qulipta . Continue Nurtec and or rizatriptan  for abortive therapy.   Follow up in 6 months

## 2024-10-30 NOTE — Progress Notes (Signed)
 PATIENT: Hayley Hamilton DOB: [birth year]  REASON FOR VISIT: follow up HISTORY FROM: patient  Chief Complaint  Patient presents with   Follow-up    Pt in room 1. Alone. Here for cpap/migraine follow up. Pt asked if she can have more than 9 Nurtec pill.     HISTORY OF PRESENT ILLNESS:  11/04/24 ALL:  Hayley Hamilton is a 48 y.o. female here today for follow up for OSA on CPAP.  She was last seen by me 09/2024 for Botox . She was seen by Dr Buck 05/2024 for concerns of sleep apnea. PSG showed mild OSA with total AHI 7.4/hr, REM AHI 36.4/h and O2 nadir 87%. AutoPAP advised. Since, she reports continuing to adjust to therapy. She is using a nasal pillow which seems to be the most comfortable but causing tenderness of nares. She has not noted any significant benefit in sleep quality or daytime energy. She does tell me that her tinnitus is improved when using therapy.   She continues Botox , Qulipta  60mg  daily and rizatriptan  as needed.  Headaches have not improved. She is having 14-16 days a month. All are migrainous. She goes through all 9 tablets of rizatriptan , every month. Nurtec was not as effective. She takes ibuprofen 800mg  twice daily for headaches and neck pain. She is followed by pain management. ESI have been minimally effective. Gabapentin did seem to give some relief in the past.   From a thorough review of records, medications tried greater than 3 months include: Tried topiramate (side effects could not tolerate), BP medications contraindicated due to hypotension tried propranolol and had hypotension, gabapentin, lyrica, robaxin, naproxen  lexapro,tried amitriptyline and nortriptyline in the past and now  cannot take amirtiptyline or nortriptyline because she is currently on another seratonergic agent lexapro and combination can cause seratonin syndrome, sumatriptan, rizatriptan , zonisamide, Emgality , botox , Ajovy .     HISTORY: (copied from Dr Obie previous note)  Dear  Hayley Hamilton,   I saw your patient, Hayley Hamilton, upon your kind request in my sleep clinic today for initial consultation of her sleep disorder, in particular, concern for underlying obstructive sleep apnea.  The patient is unaccompanied today.  As you know, Ms. Panchal is a 48 year old female with an underlying medical history of migraine headaches, chronic neck pain with status post neck fusion, hyperlipidemia, memory complaints, and obesity, who reports snoring and excessive daytime somnolence, as well as episodes of gasping for air at night; this has been ongoing for the past 6+ mo.  Her Epworth sleepiness score is 9 out of 24, fatigue severity score is 63 out of 63.  I reviewed your office note from 08/01/2023.  She had an overnight pulse oximetry test on 01/20/2024 which showed an average oxygen saturation of 94.4%, O2 nadir 87% with time below or at 88% saturation of 21 seconds.  For her migraine headaches, she receives regular Botox  injections through our office. She has been working on weight loss and has lost about 36 lb in the last 9 mo. She lives with her husband and 2 of her 3 kids. 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 She quit smoking some 17 y ago, drinks alcohol occasionally and limits her caffeine to 1-2 c coffee/day. He has chronic difficulty initiating and maintaining sleep and has been on nightly Ambien 10 mg strength for the past 17 years.  This is through her GYN.   REVIEW OF SYSTEMS: Out of a complete 14 system review of symptoms, the  patient complains only of the following symptoms, headaches, chronic neck pain and all other reviewed systems are negative.  ESS: 13/24  ALLERGIES: Allergies  Allergen Reactions   Codeine     In the past upset stomach, has had it sense with no problem    HOME MEDICATIONS: Outpatient Medications Prior to Visit  Medication Sig Dispense Refill   Atogepant  (QULIPTA ) 60 MG TABS Take 1 tablet (60 mg total) by mouth  daily. 30 tablet 11   botulinum toxin Type A  (BOTOX ) 200 units injection Provider to inject 155 units into the muscles of the head and neck every 12 weeks. Discard remainder. 1 each 3   cyclobenzaprine  (FLEXERIL ) 10 MG tablet Take 1 tablet (10 mg total) by mouth at bedtime as needed for muscle spasms. 90 tablet 3   escitalopram (LEXAPRO) 20 MG tablet Take 20 mg by mouth daily.     LORazepam (ATIVAN) 0.5 MG tablet Take 0.5 mg by mouth every 8 (eight) hours as needed for anxiety.     ondansetron  (ZOFRAN -ODT) 4 MG disintegrating tablet DISSOLVE 1 TO 2 TABLETS(4 TO 8 MG) ON THE TONGUE EVERY 8 HOURS AS NEEDED 30 tablet 3   rizatriptan  (MAXALT -MLT) 10 MG disintegrating tablet Take 1 tablet (10 mg total) by mouth as needed for migraine (max 2 tabs in 24 hrs. (2-3 times a week)). May repeat in 2 hours if needed 9 tablet 11   zolpidem (AMBIEN) 10 MG tablet   5   Rimegepant Sulfate (NURTEC) 75 MG TBDP Take 1 tablet (75 mg total) by mouth every other day. For migraines. Take as close to onset of migraine as possible. One daily maximum. 16 tablet 0   No facility-administered medications prior to visit.    PAST MEDICAL HISTORY: Past Medical History:  Diagnosis Date   Breast mass 05/02/2017   bilat breast masses and lt axilla   Migraine     PAST SURGICAL HISTORY: Past Surgical History:  Procedure Laterality Date   ABDOMINAL HYSTERECTOMY     ACDF  09/2022   APPENDECTOMY  2003   BREAST EXCISIONAL BIOPSY Right    COLONOSCOPY      FAMILY HISTORY: Family History  Problem Relation Age of Onset   Sleep apnea Mother    Colon polyps Father    Colon cancer Paternal Uncle    Colon polyps Paternal Uncle    Diabetes Maternal Grandmother    Diabetes Maternal Grandfather    Diabetes Paternal Grandmother    Irritable bowel syndrome Paternal Grandmother    Colon cancer Paternal Grandfather    Colon polyps Paternal Grandfather    Diabetes Paternal Grandfather    Heart disease Paternal Grandfather     Migraines Neg Hx     SOCIAL HISTORY: Social History   Socioeconomic History   Marital status: Married    Spouse name: Not on file   Number of children: Not on file   Years of education: Not on file   Highest education level: Not on file  Occupational History   Not on file  Tobacco Use   Smoking status: Former   Smokeless tobacco: Never   Tobacco comments:    quit 2006-2007  Vaping Use   Vaping status: Never Used  Substance and Sexual Activity   Alcohol use: Yes    Alcohol/week: 1.0 - 2.0 standard drink of alcohol    Types: 1 - 2 Standard drinks or equivalent per week    Comment: social   Drug use: No   Sexual activity: Not  on file  Other Topics Concern   Not on file  Social History Narrative   Caffeine: 2 cups coffee in the morning   Right handed   Pt lives with family    Pt works    Social Drivers of Corporate Investment Banker Strain: Not on file  Food Insecurity: Not on file  Transportation Needs: Not on file  Physical Activity: Not on file  Stress: Not on file  Social Connections: Not on file  Intimate Partner Violence: Not on file     PHYSICAL EXAM  Vitals:   11/04/24 0842  BP: 120/86  Pulse: 85  SpO2: 98%  Weight: 177 lb 8 oz (80.5 kg)  Height: 5' 5.5 (1.664 m)   Body mass index is 29.09 kg/m.  Generalized: Well developed, in no acute distress  Cardiology: normal rate and rhythm, no murmur noted Respiratory: clear to auscultation bilaterally  Neurological examination  Mentation: Alert oriented to time, place, history taking. Follows all commands speech and language fluent Cranial nerve II-XII: Pupils were equal round reactive to light. Extraocular movements were full, visual field were full  Motor: The motor testing reveals 5 over 5 strength of all 4 extremities. Good symmetric motor tone is noted throughout.  Gait and station: Gait is normal.    DIAGNOSTIC DATA (LABS, IMAGING, TESTING) - I reviewed patient records, labs, notes, testing  and imaging myself where available.      No data to display           Lab Results  Component Value Date   WBC 10.5 12/28/2023   HGB 14.2 12/28/2023   HCT 42.8 12/28/2023   MCV 92.6 12/28/2023   PLT 229 12/28/2023      Component Value Date/Time   NA 131 (L) 12/28/2023 1840   NA 139 08/01/2023 1432   K 3.8 12/28/2023 1840   CL 95 (L) 12/28/2023 1840   CO2 25 12/28/2023 1840   GLUCOSE 89 12/28/2023 1840   BUN <5 (L) 12/28/2023 1840   BUN 5 (L) 08/01/2023 1432   CREATININE 0.75 12/28/2023 1840   CALCIUM 9.3 12/28/2023 1840   PROT 6.5 12/28/2023 1840   PROT 6.9 08/01/2023 1432   ALBUMIN 3.9 12/28/2023 1840   ALBUMIN 4.4 08/01/2023 1432   AST 21 12/28/2023 1840   ALT 19 12/28/2023 1840   ALKPHOS 58 12/28/2023 1840   BILITOT 0.9 12/28/2023 1840   BILITOT <0.2 08/01/2023 1432   GFRNONAA >60 12/28/2023 1840   GFRAA  03/31/2008 0702    >60        The eGFR has been calculated using the MDRD equation. This calculation has not been validated in all clinical   No results found for: CHOL, HDL, LDLCALC, LDLDIRECT, TRIG, CHOLHDL No results found for: YHAJ8R No results found for: VITAMINB12 Lab Results  Component Value Date   TSH 1.340 08/01/2023     ASSESSMENT AND PLAN 48 y.o. year old female  has a past medical history of Breast mass (05/02/2017) and Migraine. here with     ICD-10-CM   1. OSA (obstructive sleep apnea)  G47.33 For home use only DME continuous positive airway pressure (CPAP)    2. Chronic migraine without aura, with intractable migraine, so stated, with status migrainosus  G43.711 Rimegepant Sulfate (NURTEC) 75 MG TBDP    3. Cervical myofascial pain syndrome  M79.18     4. Chronic neck pain with history of cervical spinal surgery  M54.2    G89.28    Z98.890  5. Excessive daytime sleepiness  G47.19         ZELLA DEWAN continues to adjust to CPAP therapy. Compliance report reveals sub optimal compliance. We will order a  mask refitting due to nasal irritation and leak. She was encouraged to continue using CPAP nightly and for greater than 4 hours each night. We will update supply orders as indicated. Risks of untreated sleep apnea review and education materials provided. I will switch Qulipta  to Vyepti pending insurance coverage. May consider adding gabapentin if needed. She will continue rizatriptan  and or Nurtec for abortive therapy. May take Nurtec with rizatriptan  for intractable headaches. She was encouraged to limit use of abortive meds to 1-2 times per week. Healthy lifestyle habits encouraged. She will follow up in 6 months, sooner if needed. She verbalizes understanding and agreement with this plan.    Orders Placed This Encounter  Procedures   For home use only DME continuous positive airway pressure (CPAP)    Mask refitting due to irritation and leak.    Length of Need:   Lifetime    Patient has OSA or probable OSA:   Yes    Is the patient currently using CPAP in the home:   Yes    Settings:   Other see comments    CPAP supplies needed:   Mask, headgear, cushions, filters, heated tubing and water chamber     Meds ordered this encounter  Medications   Rimegepant Sulfate (NURTEC) 75 MG TBDP    Sig: Take 1 tablet (75 mg total) by mouth daily as needed. For migraines. Take as close to onset of migraine as possible. One daily maximum.    Dispense:  8 tablet    Refill:  11    Supervising Provider:   YAN, YIJUN [[id number]]      Lorretta Kerce, FNP-C 11/04/2024, 9:28 AM [facility] [address] [place], [place] [address] [phone]

## 2024-10-31 ENCOUNTER — Encounter: Payer: Self-pay | Admitting: Neurology

## 2024-10-31 NOTE — Progress Notes (Signed)
 Hayley Hamilton

## 2024-11-04 ENCOUNTER — Encounter: Payer: Self-pay | Admitting: Family Medicine

## 2024-11-04 ENCOUNTER — Ambulatory Visit: Admitting: Family Medicine

## 2024-11-04 VITALS — BP 120/86 | HR 85 | Ht 65.5 in | Wt 177.5 lb

## 2024-11-04 DIAGNOSIS — G43711 Chronic migraine without aura, intractable, with status migrainosus: Secondary | ICD-10-CM | POA: Diagnosis not present

## 2024-11-04 DIAGNOSIS — G8928 Other chronic postprocedural pain: Secondary | ICD-10-CM

## 2024-11-04 DIAGNOSIS — G4719 Other hypersomnia: Secondary | ICD-10-CM

## 2024-11-04 DIAGNOSIS — G4733 Obstructive sleep apnea (adult) (pediatric): Secondary | ICD-10-CM | POA: Diagnosis not present

## 2024-11-04 DIAGNOSIS — M542 Cervicalgia: Secondary | ICD-10-CM | POA: Diagnosis not present

## 2024-11-04 DIAGNOSIS — M7918 Myalgia, other site: Secondary | ICD-10-CM | POA: Diagnosis not present

## 2024-11-04 DIAGNOSIS — Z9889 Other specified postprocedural states: Secondary | ICD-10-CM

## 2024-11-04 MED ORDER — NURTEC 75 MG PO TBDP: 75.0000 mg | ORAL_TABLET | Freq: Every day | ORAL | 11 refills | Status: AC | PRN

## 2024-12-25 ENCOUNTER — Other Ambulatory Visit (HOSPITAL_COMMUNITY): Payer: Self-pay

## 2025-01-06 ENCOUNTER — Other Ambulatory Visit (HOSPITAL_COMMUNITY): Payer: Self-pay

## 2025-01-06 NOTE — Progress Notes (Signed)
 Specialty Pharmacy Refill Coordination Note  Hayley Hamilton is a 49 y.o. female assessed today regarding refills of clinic administered specialty medication(s) OnabotulinumtoxinA  (BOTOX )   Clinic requested Courier to Provider Office   Delivery date: 01/15/25   Verified address: [facility] [address] [place], [place] [address]   Medication will be filled on: 01/14/25

## 2025-01-14 ENCOUNTER — Other Ambulatory Visit: Payer: Self-pay

## 2025-01-14 ENCOUNTER — Telehealth: Payer: Self-pay | Admitting: Neurology

## 2025-01-14 DIAGNOSIS — G43711 Chronic migraine without aura, intractable, with status migrainosus: Secondary | ICD-10-CM

## 2025-01-15 ENCOUNTER — Other Ambulatory Visit: Payer: Self-pay

## 2025-01-15 DIAGNOSIS — G43711 Chronic migraine without aura, intractable, with status migrainosus: Secondary | ICD-10-CM

## 2025-01-15 MED ORDER — ONABOTULINUMTOXINA 200 UNITS IJ SOLR
INTRAMUSCULAR | 3 refills | Status: AC
  Filled 2025-01-15: qty 1, 84d supply, fill #0

## 2025-01-15 NOTE — Telephone Encounter (Signed)
 Please send Botox  refills to [facility], appt is on 1/26.

## 2025-01-15 NOTE — Telephone Encounter (Signed)
 Botox  sent to [facility]

## 2025-01-15 NOTE — Telephone Encounter (Signed)
 Botox  sent to [facility]

## 2025-01-16 NOTE — Telephone Encounter (Signed)
 Pt has called and r/s her appointment which was set for 1/26

## 2025-01-20 ENCOUNTER — Ambulatory Visit: Admitting: Family Medicine

## 2025-01-24 ENCOUNTER — Encounter: Payer: Self-pay | Admitting: Family Medicine

## 2025-01-27 ENCOUNTER — Ambulatory Visit: Admitting: Family Medicine

## 2025-01-28 NOTE — Progress Notes (Unsigned)
 "   01/28/25 ALL: Hayley Hamilton returns for Botox .    10/23/2024 ALL: Hayley Hamilton returns for Botox . She reports doing well. Migraines have worsened over the past few weeks d/t being late getting Botox . She recently started CPAP. Dr ines switched Emgality  to Qulipta  07/2024. She continues Nurtec and or rizatriptan  for abortive therapy.   07/22/2024 ALL: Hayley Hamilton returns for Botox . She reports migraines have been much better. She has not had a migraine in the past 6 weeks. She continues Emgality , Nurtec and or rizatriptan .   She was seen in consult with Dr Buck for concerns of possible sleep apnea. Sleep study ordered and being performed tomorrow night. Mother has sleep apnea.   04/29/2024 ALL: Hayley Hamilton returns for second Botox  procedure. First procedure performed with Dr Ines 01/2024. She reports significant improvement in intensity and frequency of migraines for about 6 weeks but then headache returned. She is having near daily headaches. She continues Emgality , Nurtec and or rizatriptan .     Consent Form Botulism Toxin Injection For Chronic Migraine    Reviewed orally with patient, additionally signature is on file:  Botulism toxin has been approved by the Federal drug administration for treatment of chronic migraine. Botulism toxin does not cure chronic migraine and it may not be effective in some patients.  The administration of botulism toxin is accomplished by injecting a small amount of toxin into the muscles of the neck and head. Dosage must be titrated for each individual. Any benefits resulting from botulism toxin tend to wear off after 3 months with a repeat injection required if benefit is to be maintained. Injections are usually done every 3-4 months with maximum effect peak achieved by about 2 or 3 weeks. Botulism toxin is expensive and you should be sure of what costs you will incur resulting from the injection.  The side effects of botulism toxin use for chronic migraine may  include:   -Transient, and usually mild, facial weakness with facial injections  -Transient, and usually mild, head or neck weakness with head/neck injections  -Reduction or loss of forehead facial animation due to forehead muscle weakness  -Eyelid drooping  -Dry eye  -Pain at the site of injection or bruising at the site of injection  -Double vision  -Potential unknown long term risks   Contraindications: You should not have Botox  if you are pregnant, nursing, allergic to albumin, have an infection, skin condition, or muscle weakness at the site of the injection, or have myasthenia gravis, Lambert-Eaton syndrome, or ALS.  It is also possible that as with any injection, there may be an allergic reaction or no effect from the medication. Reduced effectiveness after repeated injections is sometimes seen and rarely infection at the injection site may occur. All care will be taken to prevent these side effects. If therapy is given over a long time, atrophy and wasting in the muscle injected may occur. Occasionally the patient's become refractory to treatment because they develop antibodies to the toxin. In this event, therapy needs to be modified.  I have read the above information and consent to the administration of botulism toxin.    BOTOX  PROCEDURE NOTE FOR MIGRAINE HEADACHE  Contraindications and precautions discussed with patient(above). Aseptic procedure was observed and patient tolerated procedure. Procedure performed by Greig Forbes, FNP-C.   The condition has existed for more than 6 months, and pt does not have a diagnosis of ALS, Myasthenia Gravis or Lambert-Eaton Syndrome.  Risks and benefits of injections discussed and pt agrees to proceed with the  procedure.  Written consent obtained  These injections are medically necessary. Pt  receives good benefits from these injections. These injections do not cause sedations or hallucinations which the oral therapies may cause.   Description  of procedure:  The patient was placed in a sitting position. The standard protocol was used for Botox  as follows, with 5 units of Botox  injected at each site:  -Procerus muscle, midline injection  -Corrugator muscle, bilateral injection  -Frontalis muscle, bilateral injection, with 2 sites each side, medial injection was performed in the upper one third of the frontalis muscle, in the region vertical from the medial inferior edge of the superior orbital rim. The lateral injection was again in the upper one third of the forehead vertically above the lateral limbus of the cornea, 1.5 cm lateral to the medial injection site.  -Temporalis muscle injection, 4 sites, bilaterally. The first injection was 3 cm above the tragus of the ear, second injection site was 1.5 cm to 3 cm up from the first injection site in line with the tragus of the ear. The third injection site was 1.5-3 cm forward between the first 2 injection sites. The fourth injection site was 1.5 cm posterior to the second injection site. 5th site laterally in the temporalis  muscleat the level of the outer canthus.  -Occipitalis muscle injection, 3 sites, bilaterally. The first injection was done one half way between the occipital protuberance and the tip of the mastoid process behind the ear. The second injection site was done lateral and superior to the first, 1 fingerbreadth from the first injection. The third injection site was 1 fingerbreadth superiorly and medially from the first injection site.  -Cervical paraspinal muscle injection, 2 sites, bilaterally. The first injection site was 1 cm from the midline of the cervical spine, 3 cm inferior to the lower border of the occipital protuberance. The second injection site was 1.5 cm superiorly and laterally to the first injection site.  -Trapezius muscle injection was performed at 3 sites, bilaterally. The first injection site was in the upper trapezius muscle halfway between the inflection  point of the neck, and the acromion. The second injection site was one half way between the acromion and the first injection site. The third injection was done between the first injection site and the inflection point of the neck.   Will return for repeat injection in 3 months.   A total of 200 units of Botox  was prepared, 155 units of Botox  was injected as documented above, any Botox  not injected was wasted. The patient tolerated the procedure well, there were no complications of the above procedure.   "

## 2025-02-03 ENCOUNTER — Ambulatory Visit: Admitting: Family Medicine

## 2025-02-03 DIAGNOSIS — G43711 Chronic migraine without aura, intractable, with status migrainosus: Secondary | ICD-10-CM

## 2025-02-24 ENCOUNTER — Telehealth: Admitting: Neurology

## 2025-03-03 ENCOUNTER — Ambulatory Visit: Admitting: Neurology
